# Patient Record
Sex: Male | Born: 2000 | Race: Black or African American | Hispanic: No | Marital: Single | Smoking: Never smoker
Health system: Southern US, Community
[De-identification: ages and names within clinical notes are randomized; demographics above are authoritative.]

## PROBLEM LIST (undated history)

## (undated) DIAGNOSIS — K219 Gastro-esophageal reflux disease without esophagitis: Secondary | ICD-10-CM

## (undated) DIAGNOSIS — F419 Anxiety disorder, unspecified: Secondary | ICD-10-CM

## (undated) HISTORY — DX: Gastro-esophageal reflux disease without esophagitis: K21.9

## (undated) HISTORY — DX: Anxiety disorder, unspecified: F41.9

## (undated) HISTORY — PX: OTHER SURGICAL HISTORY: SHX169

---
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
## 2020-12-20 ENCOUNTER — Ambulatory Visit: Payer: Self-pay | Attending: Internal Medicine

## 2020-12-20 DIAGNOSIS — Z23 Encounter for immunization: Secondary | ICD-10-CM

## 2020-12-20 NOTE — Progress Notes (Signed)
   Covid-19 Vaccination Clinic  Name:  Scott [name]    MRN: [id number] DOB: 08-Oct-2001  12/20/2020  [name] was observed post Covid-19 immunization for 15 minutes without incident. He was provided with Vaccine Information Sheet and instruction to access the V-Safe system.   [name] was instructed to call 911 with any severe reactions post vaccine: Marland Kitchen Difficulty breathing  . Swelling of face and throat  . A fast heartbeat  . A bad rash all over body  . Dizziness and weakness   Immunizations Administered    Name Date Dose VIS Date Route   Pfizer COVID-19 Vaccine 12/20/2020 12:07 PM 0.3 mL 10/01/2020 Intramuscular   Manufacturer: [employer], [employer]   Lot: [id number]   NDC: [address]-2

## 2021-01-09 ENCOUNTER — Ambulatory Visit: Payer: BC Managed Care – PPO | Admitting: Family Medicine

## 2021-01-09 ENCOUNTER — Encounter: Payer: Self-pay | Admitting: Family Medicine

## 2021-01-09 VITALS — BP 130/68 | HR 82 | Ht 67.0 in | Wt 161.8 lb

## 2021-01-09 DIAGNOSIS — K219 Gastro-esophageal reflux disease without esophagitis: Secondary | ICD-10-CM | POA: Diagnosis not present

## 2021-01-09 DIAGNOSIS — Z7689 Persons encountering health services in other specified circumstances: Secondary | ICD-10-CM

## 2021-01-09 NOTE — Patient Instructions (Signed)
Try to avoid eating foods that cause you heartburn and acid reflux.   Avoid overeating and try to eat small, frequent meals.   Avoid eating and laying down within 2-3 hours.   Continue taking omeprazole once daily as needed. You can also try Tums or Pepcid in the evening or in addition for breakthrough symptoms.   Let me know if your symptoms are worsening or you are having to take medication more often.   Try getting yourself on a sleep schedule.     Food Choices for Gastroesophageal Reflux Disease, Adult When you have gastroesophageal reflux disease (GERD), the foods you eat and your eating habits are very important. Choosing the right foods can help ease your discomfort. Think about working with a food expert (dietitian) to help you make good choices. What are tips for following this plan? Reading food labels  Look for foods that are low in saturated fat. Foods that may help with your symptoms include: ? Foods that have less than 5% of daily value (DV) of fat. ? Foods that have 0 grams of trans fat. Cooking  Do not fry your food.  Cook your food by baking, steaming, grilling, or broiling. These are all methods that do not need a lot of fat for cooking.  To add flavor, try to use herbs that are low in spice and acidity. Meal planning  Choose healthy foods that are low in fat, such as: ? Fruits and vegetables. ? Whole grains. ? Low-fat dairy products. ? Lean meats, fish, and poultry.  Eat small meals often instead of eating 3 large meals each day. Eat your meals slowly in a place where you are relaxed. Avoid bending over or lying down until 2-3 hours after eating.  Limit high-fat foods such as fatty meats or fried foods.  Limit your intake of fatty foods, such as oils, butter, and shortening.  Avoid the following as told by your doctor: ? Foods that cause symptoms. These may be different for different people. Keep a food diary to keep track of foods that cause  symptoms. ? Alcohol. ? Drinking a lot of liquid with meals. ? Eating meals during the 2-3 hours before bed.   Lifestyle  Stay at a healthy weight. Ask your doctor what weight is healthy for you. If you need to lose weight, work with your doctor to do so safely.  Exercise for at least 30 minutes on 5 or more days each week, or as told by your doctor.  Wear loose-fitting clothes.  Do not smoke or use any products that contain nicotine or tobacco. If you need help quitting, ask your doctor.  Sleep with the head of your bed higher than your feet. Use a wedge under the mattress or blocks under the bed frame to raise the head of the bed.  Chew sugar-free gum after meals. What foods should eat? Eat a healthy, well-balanced diet of fruits, vegetables, whole grains, low-fat dairy products, lean meats, fish, and poultry. Each person is different. Foods that may cause symptoms in one person may not cause any symptoms in another person. Work with your doctor to find foods that are safe for you. The items listed above may not be a complete list of what you can eat and drink. Contact a food expert for more options.   What foods should I avoid? Limiting some of these foods may help in managing the symptoms of GERD. Everyone is different. Talk with a food expert or your doctor to  help you find the exact foods to avoid, if any. Fruits Any fruits prepared with added fat. Any fruits that cause symptoms. For some people, this may include citrus fruits, such as oranges, grapefruit, pineapple, and lemons. Vegetables Deep-fried vegetables. [place] fries. Any vegetables prepared with added fat. Any vegetables that cause symptoms. For some people, this may include tomatoes and tomato products, chili peppers, onions and garlic, and horseradish. Grains Pastries or quick breads with added fat. Meats and other proteins High-fat meats, such as fatty beef or pork, hot dogs, ribs, ham, sausage, salami, and bacon. Fried  meat or protein, including fried fish and fried chicken. Nuts and nut butters, in large amounts. Dairy Whole milk and chocolate milk. Sour cream. Cream. Ice cream. Cream cheese. Milkshakes. Fats and oils Butter. Margarine. Shortening. Ghee. Beverages Coffee and tea, with or without caffeine. Carbonated beverages. Sodas. Energy drinks. Fruit juice made with acidic fruits, such as orange or grapefruit. Tomato juice. Alcoholic drinks. Sweets and desserts Chocolate and cocoa. Donuts. Seasonings and condiments Pepper. Peppermint and spearmint. Added salt. Any condiments, herbs, or seasonings that cause symptoms. For some people, this may include curry, hot sauce, or vinegar-based salad dressings. The items listed above may not be a complete list of what you should not eat and drink. Contact a food expert for more options. Questions to ask your doctor Diet and lifestyle changes are often the first steps that are taken to manage symptoms of GERD. If diet and lifestyle changes do not help, talk with your doctor about taking medicines. Where to find more information  International Foundation for Gastrointestinal Disorders: aboutgerd.org Summary  When you have GERD, food and lifestyle choices are very important in easing your symptoms.  Eat small meals often instead of 3 large meals a day. Eat your meals slowly and in a place where you are relaxed.  Avoid bending over or lying down until 2-3 hours after eating.  Limit high-fat foods such as fatty meats or fried foods. This information is not intended to replace advice given to you by your health care provider. Make sure you discuss any questions you have with your health care provider. Document Revised: 06/09/2020 Document Reviewed: 06/09/2020 Elsevier Patient Education  2021 [employer].

## 2021-01-09 NOTE — Progress Notes (Signed)
   Subjective:    Patient ID: Scott [name], male    DOB: 2001-10-04, 20 y.o.   MRN: [id number]  HPI Chief Complaint  Patient presents with  . new pt    New pt get established. Acid reflux- been going on a while, trouble sleeping   He is new to the practice and here today to establish care. Denies any regular previous medical care over the past 3 years.  Denies any significant past medical history.  No surgeries.  Concerns today include acid reflux and difficulty sleeping.  States he has a burning sensation in his chest after eating certain foods or drinking beverages such as orange juice.  States this has been going on for a year or longer.  States he takes omeprazole and occasionally a chewable antacid.  States this treatment usually improves his symptoms.   He also reports that he stays up all night and usually goes to bed right before daylight and then sleeps during the day.  States he is not currently in school or working so he is not on any particular schedule.  Denies having a TV in his room.  Denies fever, chills, fatigue, loss of appetite, unexplained weight loss, dizziness, chest pain, palpitations, shortness of breath, abdominal pain, N/V/D, or urinary symptoms.   Denies alcohol use, smoking or drug use.   Review of Systems Pertinent positives and negatives in the history of present illness.     Objective:   Physical Exam BP 130/68   Pulse 82   Ht 5\' 7"  (1.702 m)   Wt 161 lb 12.8 oz (73.4 kg)   BMI 25.34 kg/m   Alert and in no distress. Cardiac exam shows a regular sinus rhythm without murmurs or gallops. Lungs are clear to auscultation.  Abdomen is soft, nondistended, normal bowel sounds, nontender, no rebound or guarding.       Assessment & Plan:  Gastroesophageal reflux disease, unspecified whether esophagitis present  Sleep concern  Encounter to establish care  He is a 20 year old male who is new to the practice and here to establish care. Discussed  in depth how to prevent acid reflux and a handout was also provided.  Recommend he avoid foods and beverages that trigger his reflux symptoms.  Encouraged eating smaller more frequent meals and avoiding eating and laying down.  Offered referral to GI for further evaluation and he declines at this time. He will continue taking omeprazole over-the-counter as needed and may also try Pepcid or Tums.  Encouraged him to let me know if his symptoms are worsening or he is needing the medication more often. I recommend that he come up with a better sleep-wake schedule.  This is more difficult since he is not having to get up to go to work or class.  Encouraged him to try and go to bed to sleep around 11 PM or midnight and wake up at 7 AM or 8 AM He may follow-up for a CPE at his convenience

## 2021-01-13 ENCOUNTER — Ambulatory Visit: Payer: Self-pay

## 2021-03-04 NOTE — Patient Instructions (Addendum)
You were seen today for a wellness visit.  You may return at your convenience for immunizations including your Covid booster, Tdap (tetanus, diphtheria and pertussis) if you find that it has been more than 10 years since your last one, and the Gardasil vaccine other known as human papilloma virus.  This is a 3 shot series. You can schedule a nurse visit for any of these vaccines.  I recommend scheduling a dental exam. I also recommend an eye exam when you are due.  Make sure you are doing monthly self testicular exams as we discussed.  Continue eating a healthy well-balanced diet and try to get at least 150 minutes of physical activity each week.  We will be in touch with your lab results.   Preventive Care 60-78 Years Old, Male Preventive care refers to lifestyle choices and visits with your health care provider that can promote health and wellness. At this stage in your life, you may start seeing a primary care physician instead of a pediatrician. It is important to take responsibility for your health and well-being. Preventive care for young adults includes:  A yearly physical exam. This is also called an annual wellness visit.  Regular dental and eye exams.  Immunizations.  Screening for certain conditions.  Healthy lifestyle choices, such as: ? Eating a healthy diet. ? Getting regular exercise. ? Not using drugs or products that contain nicotine and tobacco. ? Limiting alcohol use. What can I expect for my preventive care visit? Physical exam Your health care provider may check your:  Height and weight. These may be used to calculate your BMI (body mass index). BMI is a measurement that tells if you are at a healthy weight.  Heart rate and blood pressure.  Body temperature.  Skin for abnormal spots. Counseling Your health care provider may ask you questions about your:  Past medical problems.  Family's medical history.  Alcohol, tobacco, and drug use.  Home  life and relationship well-being.  Access to firearms.  Emotional well-being.  Diet, exercise, and sleep habits.  Sexual activity and sexual health. What immunizations do I need? Vaccines are usually given at various ages, according to a schedule. Your health care provider will recommend vaccines for you based on your age, medical history, and lifestyle or other factors, such as travel or where you work.   What tests do I need? Blood tests  Lipid and cholesterol levels. These may be checked every 5 years starting at age 64.  Hepatitis C test.  Hepatitis B test. Screening  Genital exam to check for testicular cancer or hernias.  STD (sexually transmitted disease) testing, if you are at risk. Other tests  Tuberculosis skin test.  Vision and hearing tests.  Skin exam. Talk with your health care provider about your test results, treatment options, and if necessary, the need for more tests. Follow these instructions at home: Eating and drinking  Eat a healthy diet that includes fresh fruits and vegetables, whole grains, lean protein, and low-fat dairy products.  Drink enough fluid to keep your urine pale yellow.  Do not drink alcohol if: ? Your health care provider tells you not to drink. ? You are under the legal drinking age. In the [place] the legal drinking age is 23.  If you drink alcohol: ? Limit how much you use to 0-2 drinks a day. ? Be aware of how much alcohol is in your drink. In the [place] one drink equals one 12 oz bottle of beer (355  mL), one 5 oz glass of wine (148 mL), or one 1 oz glass of hard liquor (44 mL).   Lifestyle  Take daily care of your teeth and gums. Brush your teeth every morning and night with fluoride toothpaste. Floss one time each day.  Stay active. Exercise for at least 30 minutes 5 or more days of the week.  Do not use any products that contain nicotine or tobacco, such as cigarettes, e-cigarettes, and chewing tobacco. If you need help  quitting, ask your health care provider.  Do not use drugs.  If you are sexually active, practice safe sex. Use a condom or other form of protection to prevent STIs (sexually transmitted infections).  Find healthy ways to cope with stress, such as: ? Meditation, yoga, or listening to music. ? Journaling. ? Talking to a trusted person. ? Spending time with friends and family. Safety  Always wear your seat belt while driving or riding in a vehicle.  Do not drive: ? If you have been drinking alcohol. Do not ride with someone who has been drinking. ? When you are tired or distracted. ? While texting.  Wear a helmet and other protective equipment during sports activities.  If you have firearms in your house, make sure you follow all gun safety procedures.  Seek help if you have been bullied, physically abused, or sexually abused.  Use the Internet responsibly to avoid dangers, such as online bullying and online sex predators. What's next?  Go to your health care provider once a year for an annual wellness visit.  Ask your health care provider how often you should have your eyes and teeth checked.  Stay up to date on all vaccines. This information is not intended to replace advice given to you by your health care provider. Make sure you discuss any questions you have with your health care provider. Document Revised: 08/15/2019 Document Reviewed: 11/23/2018 Elsevier Patient Education  2021 [employer].

## 2021-03-04 NOTE — Progress Notes (Signed)
Subjective:    Patient ID: Scott [name], male    DOB: 2001-06-10, 20 y.o.   MRN: [id number]  HPI Chief Complaint  Patient presents with  . Annual Exam    CPE some issues with acid reflux sometimes daily and sometimes weekly   He is here for a complete physical exam.   Other providers: None   GERD-reports taking acid medication and this has improved.   Reports sleeping better.  No new concerns today.   Social history: Lives with mother and sister, is not currently working or in school. Denies smoking, drinking alcohol, drug use Diet: home cooked meals and frozen meals.  Exercise: nothing regular. Squats and stretching   Immunizations: He did receive 2 Covid vaccines.  Unclear as to when he had his last Tdap.  HPV never.  Health maintenance:  Colonoscopy: N/A Last PSA: N/A Last Dental Exam: over a year  Last Eye Exam: last year   Wears seatbelt always, smoke detectors in home and functioning, does not text while driving, feels safe in home environment.  Reviewed allergies, medications, past medical, surgical, family, and social history.   Review of Systems Review of Systems Constitutional: -fever, -chills, -sweats, -unexpected weight change,-fatigue ENT: -runny nose, -ear pain, -sore throat Cardiology:  -chest pain, -palpitations, -edema Respiratory: -cough, -shortness of breath, -wheezing Gastroenterology: -abdominal pain, -nausea, -vomiting, -diarrhea, -constipation  Hematology: -bleeding or bruising problems Musculoskeletal: -arthralgias, -myalgias, -joint swelling, -back pain Ophthalmology: -vision changes Urology: -dysuria, -difficulty urinating, -hematuria, -urinary frequency, -urgency Neurology: -headache, -weakness, -tingling, -numbness       Objective:   Physical Exam BP 120/78   Pulse 65   Temp 98.3 F (36.8 C)   Ht 5\' 7"  (1.702 m)   Wt 159 lb 6.4 oz (72.3 kg)   BMI 24.97 kg/m   General Appearance:    Alert, cooperative, no distress,  appears stated age  Head:    Normocephalic, without obvious abnormality, atraumatic  Eyes:    PERRL, conjunctiva/corneas clear, EOM's intact  Ears:    Normal TM's and external ear canals  Nose:   Mask on   Throat:   Mask on   Neck:   Supple, no lymphadenopathy;  thyroid:  no   enlargement/tenderness/nodules; no JVD  Back:    Spine nontender, no curvature, ROM normal, no CVA     tenderness  Lungs:     Clear to auscultation bilaterally without wheezes, rales or     ronchi; respirations unlabored  Chest Wall:    No tenderness or deformity   Heart:    Regular rate and rhythm, S1 and S2 normal, no murmur, rub   or gallop  Breast Exam:    No chest wall tenderness, masses or gynecomastia  Abdomen:     Soft, non-tender, nondistended, normoactive bowel sounds,    no masses, no hepatosplenomegaly  Genitalia:    Normal male external genitalia without lesions.  Testicles without masses.  No inguinal hernias. Chaperone present   Rectal:   Deferred due to age <40 and lack of symptoms  Extremities:   No clubbing, cyanosis or edema  Pulses:   2+ and symmetric all extremities  Skin:   Skin color, texture, turgor normal, no rashes or lesions  Lymph nodes:   Cervical, supraclavicular, and axillary nodes normal  Neurologic:   CNII-XII intact, normal strength, sensation and gait; reflexes 2+ and symmetric throughout          Psych:   Normal mood, affect, hygiene and grooming.  Assessment & Plan:  Routine general medical examination at a health care facility - Plan: CBC with Differential/Platelet, Comprehensive metabolic panel, TSH, T4, free, T3, Lipid panel  Need for hepatitis C screening test - Plan: Hepatitis C antibody  Screen for STD (sexually transmitted disease) - Plan: RPR, HIV Antibody (routine testing w rflx), GC/Chlamydia Probe Amp  Screening for lipid disorders - Plan: Lipid panel  Screening for thyroid disorder - Plan: TSH, T4, free, T3  He is here today for fasting CPE.  Preventive  health care reviewed.  Recommend regular dental and eye exams.  Recommend self testicular exams and advised of increased risk of testicular cancer for his age group.  Counseling on healthy diet and exercise. Screening done for STDs per patient request. Screening for hepatitis C per guidelines. He reports improvement in GERD and insomnia.  May continue on PPI for now Discussed safety and health promotion. Follow-up pending lab results.  Counseling done on immunizations for his age group and he may return for immunizations including Covid booster, Gardasil, Tdap.

## 2021-03-05 ENCOUNTER — Other Ambulatory Visit: Payer: Self-pay

## 2021-03-05 ENCOUNTER — Ambulatory Visit: Payer: BC Managed Care – PPO | Admitting: Family Medicine

## 2021-03-05 ENCOUNTER — Encounter: Payer: Self-pay | Admitting: Family Medicine

## 2021-03-05 VITALS — BP 120/78 | HR 65 | Temp 98.3°F | Ht 67.0 in | Wt 159.4 lb

## 2021-03-05 DIAGNOSIS — Z1329 Encounter for screening for other suspected endocrine disorder: Secondary | ICD-10-CM

## 2021-03-05 DIAGNOSIS — Z1159 Encounter for screening for other viral diseases: Secondary | ICD-10-CM

## 2021-03-05 DIAGNOSIS — Z13220 Encounter for screening for lipoid disorders: Secondary | ICD-10-CM

## 2021-03-05 DIAGNOSIS — Z0000 Encounter for general adult medical examination without abnormal findings: Secondary | ICD-10-CM

## 2021-03-05 DIAGNOSIS — Z113 Encounter for screening for infections with a predominantly sexual mode of transmission: Secondary | ICD-10-CM | POA: Diagnosis not present

## 2021-03-05 LAB — CBC WITH DIFFERENTIAL/PLATELET
Basophils Absolute: 0 10*3/uL (ref 0.0–0.2)
Basos: 1 %
Monocytes Absolute: 0.7 10*3/uL (ref 0.1–0.9)
Neutrophils Absolute: 4.2 10*3/uL (ref 1.4–7.0)

## 2021-03-05 LAB — COMPREHENSIVE METABOLIC PANEL

## 2021-03-05 LAB — TSH

## 2021-03-05 LAB — LIPID PANEL

## 2021-03-05 LAB — HIV ANTIBODY (ROUTINE TESTING W REFLEX)

## 2021-03-05 LAB — T3

## 2021-03-06 LAB — CBC WITH DIFFERENTIAL/PLATELET
EOS (ABSOLUTE): 0.3 10*3/uL (ref 0.0–0.4)
Eos: 4 %
Hematocrit: 44.7 % (ref 37.5–51.0)
Hemoglobin: 14.9 g/dL (ref 13.0–17.7)
Immature Grans (Abs): 0 10*3/uL (ref 0.0–0.1)
Immature Granulocytes: 0 %
Lymphocytes Absolute: 2.9 10*3/uL (ref 0.7–3.1)
Lymphs: 36 %
MCH: 28.5 pg (ref 26.6–33.0)
MCHC: 33.3 g/dL (ref 31.5–35.7)
MCV: 86 fL (ref 79–97)
Monocytes: 8 %
Neutrophils: 51 %
Platelets: 213 10*3/uL (ref 150–450)
RBC: 5.23 x10E6/uL (ref 4.14–5.80)
RDW: 13.3 % (ref 11.6–15.4)
WBC: 8.1 10*3/uL (ref 3.4–10.8)

## 2021-03-06 LAB — RPR: RPR Ser Ql: NONREACTIVE

## 2021-03-06 LAB — COMPREHENSIVE METABOLIC PANEL
ALT: 12 IU/L (ref 0–44)
AST: 20 IU/L (ref 0–40)
Albumin/Globulin Ratio: 1.3 (ref 1.2–2.2)
Alkaline Phosphatase: 85 IU/L (ref 44–121)
BUN/Creatinine Ratio: 20 (ref 9–20)
BUN: 19 mg/dL (ref 6–20)
Bilirubin Total: 0.4 mg/dL (ref 0.0–1.2)
CO2: 22 mmol/L (ref 20–29)
Creatinine, Ser: 0.95 mg/dL (ref 0.76–1.27)
Glucose: 92 mg/dL (ref 65–99)
Potassium: 4 mmol/L (ref 3.5–5.2)
Sodium: 142 mmol/L (ref 134–144)
Total Protein: 7.8 g/dL (ref 6.0–8.5)
eGFR: 117 mL/min/{1.73_m2} (ref >59)

## 2021-03-06 LAB — LIPID PANEL
Chol/HDL Ratio: 2.9 ratio (ref 0.0–5.0)
Cholesterol, Total: 139 mg/dL (ref 100–199)
HDL: 48 mg/dL (ref >39)
Triglycerides: 104 mg/dL (ref 0–149)

## 2021-03-06 LAB — GC/CHLAMYDIA PROBE AMP
Chlamydia trachomatis, NAA: NEGATIVE
Neisseria Gonorrhoeae by PCR: NEGATIVE

## 2021-03-06 LAB — T4, FREE: Free T4: 1.25 ng/dL (ref 0.82–1.77)

## 2021-03-06 LAB — HEPATITIS C ANTIBODY: Hep C Virus Ab: 0.1 s/co ratio (ref 0.0–0.9)

## 2021-03-06 NOTE — Progress Notes (Signed)
All of his labs are normal I am still waiting on the urine results, this usually takes a couple of days.

## 2021-03-08 NOTE — Progress Notes (Signed)
He is negative for gonorrhea and chlamydia which is good.

## 2021-05-07 ENCOUNTER — Encounter: Payer: Self-pay | Admitting: Family Medicine

## 2021-05-07 ENCOUNTER — Other Ambulatory Visit: Payer: Self-pay

## 2021-05-07 ENCOUNTER — Ambulatory Visit: Payer: BC Managed Care – PPO | Admitting: Family Medicine

## 2021-05-07 VITALS — BP 120/70 | HR 82 | Wt 155.0 lb

## 2021-05-07 DIAGNOSIS — K219 Gastro-esophageal reflux disease without esophagitis: Secondary | ICD-10-CM | POA: Diagnosis not present

## 2021-05-07 DIAGNOSIS — R1310 Dysphagia, unspecified: Secondary | ICD-10-CM | POA: Diagnosis not present

## 2021-05-07 DIAGNOSIS — L219 Seborrheic dermatitis, unspecified: Secondary | ICD-10-CM | POA: Diagnosis not present

## 2021-05-07 MED ORDER — KETOCONAZOLE 2 % EX SHAM: 1.0000 "application " | MEDICATED_SHAMPOO | CUTANEOUS | 0 refills | Status: DC

## 2021-05-07 NOTE — Patient Instructions (Signed)
Avoid foods that make your stomach pain worse or foods that cause you difficulty swallowing.  Continue on the omeprazole for now.  You will hear from [facility] gastroenterology to schedule a visit.  Use the shampoo I prescribed you for your neck. Continue using a good moisturizer and over the counter hydrocortisone as needed but never more than 1-2 weeks at a time. Let me know if your eczema is getting worse.

## 2021-05-07 NOTE — Progress Notes (Signed)
   Subjective:    Patient ID: Scott [name], male    DOB: 10/24/2001, 20 y.o.   MRN: [id number]  HPI Chief Complaint  Patient presents with  . Abdominal Pain    Stomach pain is not helping with what was recommended, more acid reflux   He is here with his mother complaining of persistent upper abdominal pain and burning in his chest.  He is taking omeprazole and it does not seem to be helping. States he also has noticed that food sometimes seems to get stuck and he points to his chest.  He would also like to discuss eczema and a rash on the back of his neck just below the hairline.  He has been using Eucerin and other over-the-counter topical medications.  Using hydrocortisone per mother.  Denies fever, chills, dizziness, chest pain, palpitations, shortness of breath, cough, nausea, vomiting or diarrhea.  No changes in bowel habits. He and his mother report that he has a good appetite and is still eating normally.   Review of Systems Pertinent positives and negatives in the history of present illness.     Objective:   Physical Exam Constitutional:      General: He is not in acute distress.    Appearance: He is well-developed. He is not ill-appearing.  HENT:     Head:      Comments: Dry patch with some scaling to his right posterior scalp and below hairline Cardiovascular:     Rate and Rhythm: Normal rate and regular rhythm.     Heart sounds: Normal heart sounds.  Pulmonary:     Effort: Pulmonary effort is normal.     Breath sounds: Normal breath sounds.  Abdominal:     General: Abdomen is flat. Bowel sounds are normal. There is no distension.     Palpations: Abdomen is soft. There is no hepatomegaly.     Tenderness: There is no abdominal tenderness. There is no guarding or rebound. Negative signs include Murphy's sign and McBurney's sign.  Skin:    General: Skin is warm and dry.     Capillary Refill: Capillary refill takes less than 2 seconds.  Neurological:     Mental  Status: He is alert.    BP 120/70   Pulse 82   Wt 155 lb (70.3 kg)   BMI 24.28 kg/m         Assessment & Plan:  Gastroesophageal reflux disease, unspecified whether esophagitis present - Plan: Ambulatory referral to Gastroenterology  Dysphagia, unspecified type - Plan: Ambulatory referral to Gastroenterology  Seborrheic eczema of scalp - Plan: ketoconazole (NIZORAL) 2 % shampoo  He has a documented 4 pound weight loss since his last visit in March. Referral to GI for further evaluation. Discussed avoiding foods that trigger symptoms.  Continue with good moisturizer and OTC steroid prn. I will treat with with ketoconazole for his neck and scalp and he will follow up if worsening or not improving. Consider referral to dermatologist if he gets much worse.

## 2021-05-25 ENCOUNTER — Encounter: Payer: Self-pay | Admitting: Nurse Practitioner

## 2021-06-12 HISTORY — PX: ESOPHAGEAL DILATION: SHX303

## 2021-06-12 HISTORY — PX: ESOPHAGOGASTRODUODENOSCOPY: SHX1529

## 2021-06-25 ENCOUNTER — Encounter: Payer: Self-pay | Admitting: Nurse Practitioner

## 2021-06-25 ENCOUNTER — Ambulatory Visit: Payer: BC Managed Care – PPO | Admitting: Nurse Practitioner

## 2021-06-25 VITALS — BP 120/60 | HR 68 | Ht 67.0 in | Wt 155.0 lb

## 2021-06-25 DIAGNOSIS — K219 Gastro-esophageal reflux disease without esophagitis: Secondary | ICD-10-CM | POA: Diagnosis not present

## 2021-06-25 DIAGNOSIS — R1310 Dysphagia, unspecified: Secondary | ICD-10-CM

## 2021-06-25 NOTE — Patient Instructions (Addendum)
Continue Omeprazole every AM   You have been scheduled for an endoscopy. Please follow written instructions given to you at your visit today. If you use inhalers (even only as needed), please bring them with you on the day of your procedure.   If you are age 22 or older, your body mass index should be between 23-30. Your Body mass index is 24.28 kg/m. If this is out of the aforementioned range listed, please consider follow up with your Primary Care Provider.  If you are age 25 or younger, your body mass index should be between 19-25. Your Body mass index is 24.28 kg/m. If this is out of the aformentioned range listed, please consider follow up with your Primary Care Provider.   __________________________________________________________  The [place] [facility] providers would like to encourage you to use [facility] to communicate with providers for non-urgent requests or questions.  Due to long hold times on the telephone, sending your provider a message by [facility] may be a faster and more efficient way to get a response.  Please allow 48 business hours for a response.  Please remember that this is for non-urgent requests.    Due to recent changes in healthcare laws, you may see the results of your imaging and laboratory studies on MyChart before your provider has had a chance to review them.  We understand that in some cases there may be results that are confusing or concerning to you. Not all laboratory results come back in the same time frame and the provider may be waiting for multiple results in order to interpret others.  Please give [place] 48 hours in order for your provider to thoroughly review all the results before contacting the office for clarification of your results.    I appreciate the  opportunity to care for you  Thank You   Midge Minium

## 2021-06-25 NOTE — Progress Notes (Signed)
     ASSESSMENT AND PLAN    # 20 year old male with 2-year history of GERD symptoms  Having frequent breakthrough heartburn and regurgitation despite daily PPI and antireflux measures. He also has been having intermittent solid food dysphagia over the last several months.   -- For further evaluation patient will be scheduled for an EGD with possible dilation. The risks and benefits of EGD with possible biopsies was discussed with the patient and he agrees to proceed.  --Continue daily Omeprazole. At some point will consider adding famotidine in the evening    HISTORY OF PRESENT ILLNESS     Chief Complaint : GERD  Scott [name] is a 20 y.o. healthy male , new to the practice and referred by PCP for evaluation of GERD.    Patient is here with his mother . He gives a 2-year history of heartburn and regurgitation.  He has tried Tums which helped to some degree.  He has tried multiple other antacids without significant benefit.   A few months ago he stopped eating spicy foods which helped. He does not consume caffeinated beverages.  He goes to bed on an empty stomach.  Despite taking OTC omeprazole daily for the last year he continues to get GERD symptoms about 4 times a week, mainly in the evening.  In addition to GERD symptoms patient has had occasional solid food dysphagia over the last several months.  He has unintentionally lost about 6 pounds in the last 2 months.  No other GI complaints.  Data Reviewed: 03/05/2021 CMP, CBC normal TSH and free T4 normal   PREVIOUS EVALUATIONS:   none  Past Medical History:  Diagnosis Date   Acid reflux     No past surgical history on file. No family history on file. Social History   Tobacco Use   Smoking status: Never   Smokeless tobacco: Never  Substance Use Topics   Alcohol use: No   Drug use: Never   Current Outpatient Medications  Medication Sig Dispense Refill   ketoconazole (NIZORAL) 2 % shampoo Apply 1 application topically 2  (two) times a week. 120 mL 0   OMEPRAZOLE PO Take by mouth.     No current facility-administered medications for this visit.   No Known Allergies   Review of Systems: Positive for sleeping problems, allergies, anxiety.  All other systems reviewed and negative except where noted in HPI.    PHYSICAL EXAM :    Wt Readings from Last 3 Encounters:  05/07/21 155 lb (70.3 kg)  03/05/21 159 lb 6.4 oz (72.3 kg)  01/09/21 161 lb 12.8 oz (73.4 kg)    BP 120/60   Pulse 68   Ht 5\' 7"  (1.702 m)   Wt 155 lb (70.3 kg)   BMI 24.28 kg/m  Constitutional:  Pleasant male in no acute distress. Psychiatric: Normal mood and affect. Behavior is normal. EENT: Pupils normal.  Conjunctivae are normal. No scleral icterus. Neck supple.  Cardiovascular: Normal rate, regular rhythm. No edema Pulmonary/chest: Effort normal and breath sounds normal. No wheezing, rales or rhonchi. Abdominal: Soft, nondistended, nontender. Bowel sounds active throughout. There are no masses palpable. No hepatomegaly. Neurological: Alert and oriented to person place and time. Skin: Skin is warm and dry. No rashes noted.  , NP  06/25/2021, 11:38 AM  Cc:  Referring Provider 06/27/2021, NP-C

## 2021-07-10 ENCOUNTER — Other Ambulatory Visit: Payer: Self-pay

## 2021-07-10 ENCOUNTER — Ambulatory Visit (AMBULATORY_SURGERY_CENTER): Payer: BC Managed Care – PPO | Admitting: Internal Medicine

## 2021-07-10 ENCOUNTER — Encounter: Payer: Self-pay | Admitting: Internal Medicine

## 2021-07-10 VITALS — BP 103/47 | HR 65 | Temp 99.5°F | Resp 13 | Ht 67.0 in | Wt 155.0 lb

## 2021-07-10 DIAGNOSIS — K219 Gastro-esophageal reflux disease without esophagitis: Secondary | ICD-10-CM

## 2021-07-10 DIAGNOSIS — R1319 Other dysphagia: Secondary | ICD-10-CM

## 2021-07-10 DIAGNOSIS — K209 Esophagitis, unspecified without bleeding: Secondary | ICD-10-CM

## 2021-07-10 MED ORDER — SODIUM CHLORIDE 0.9 % IV SOLN: 500.0000 mL | Freq: Once | INTRAVENOUS | Status: DC

## 2021-07-10 NOTE — Op Note (Signed)
[facility] Patient Name: Euriah Matlack Procedure Date: 07/10/2021 2:44 PM MRN: [id number] Endoscopist: Beverley Fiedler , MD Age: 20 Referring MD:  Date of Birth: [birth year] Gender: Male Account #: [id number] Procedure:                Upper GI endoscopy Indications:              Dysphagia, Suspected gastro-esophageal reflux                            disease (heartburn/regurgitation) Medicines:                Monitored Anesthesia Care Procedure:                Pre-Anesthesia Assessment:                           - Prior to the procedure, a History and Physical                            was performed, and patient medications and                            allergies were reviewed. The patient's tolerance of                            previous anesthesia was also reviewed. The risks                            and benefits of the procedure and the sedation                            options and risks were discussed with the patient.                            All questions were answered, and informed consent                            was obtained. Prior Anticoagulants: The patient has                            taken no previous anticoagulant or antiplatelet                            agents. ASA Grade Assessment: II - A patient with                            mild systemic disease. After reviewing the risks                            and benefits, the patient was deemed in                            satisfactory condition to undergo the procedure.  After obtaining informed consent, the endoscope was                            passed under direct vision. Throughout the                            procedure, the patient's blood pressure, pulse, and                            oxygen saturations were monitored continuously. The                            Endoscope was introduced through the mouth, and                            advanced to the second part  of duodenum. The upper                            GI endoscopy was accomplished without difficulty.                            The patient tolerated the procedure well. Scope In: Scope Out: Findings:                 The examined esophagus was normal. Biopsies were                            obtained from the proximal and distal esophagus                            with cold forceps for histology to exclude                            eosinophilic esophagitis.                           No endoscopic abnormality was evident in the                            esophagus to explain the patient's complaint of                            dysphagia. It was decided, however, to proceed with                            dilation of the entire esophagus. The scope was                            withdrawn. Dilation was performed with a Maloney                            dilator with mild resistance at 52 Fr. The dilation  site was examined following endoscope reinsertion                            and showed no change.                           The gastroesophageal flap valve was visualized                            endoscopically and classified as Hill Grade II                            (fold present, opens with respiration).                           The entire examined stomach was normal.                           The examined duodenum was normal. Complications:            No immediate complications. Estimated Blood Loss:     Estimated blood loss: none. Impression:               - Normal esophagus. Biopsied.                           - No endoscopic esophageal abnormality to explain                            patient's dysphagia. Esophagus dilated with 52 Fr                            Maloney.                           - Normal stomach.                           - Normal examined duodenum. Recommendation:           - Patient has a contact number available for                             emergencies. The signs and symptoms of potential                            delayed complications were discussed with the                            patient. Return to normal activities tomorrow.                            Written discharge instructions were provided to the                            patient.                           -  Post-dilation protocol and then advance diet as                            tolerated.                           - Continue present medications.                           - Await pathology results.                           - If heartburn symptoms persistent then would                            recommend change in PPI to pantoprazole 40 mg once                            daily. If dysphagia persists after dilation and                            biopsies negative for inflammation then esophageal                            manometry is recommended. Beverley Fiedler, MD 07/10/2021 3:11:46 PM This report has been signed electronically.

## 2021-07-10 NOTE — Progress Notes (Signed)
VS by CW. ?

## 2021-07-10 NOTE — Progress Notes (Signed)
No problems noted in the recovery room. maw 

## 2021-07-10 NOTE — Progress Notes (Signed)
Addendum: Reviewed and agree with assessment and management plan. Burney Calzadilla M, MD  

## 2021-07-10 NOTE — Patient Instructions (Addendum)
Handout was given to your care partner on the esophageal dilatation diet to follow the rest of today. You may resume your current medications today. Await biopsy results.  May take 1-3 weeks to receive pathology results. Please call if any questions or concerns.      YOU HAD AN ENDOSCOPIC PROCEDURE TODAY AT THE  ENDOSCOPY CENTER:   Refer to the procedure report that was given to you for any specific questions about what was found during the examination.  If the procedure report does not answer your questions, please call your gastroenterologist to clarify.  If you requested that your care partner not be given the details of your procedure findings, then the procedure report has been included in a sealed envelope for you to review at your convenience later.  YOU SHOULD EXPECT: Some feelings of bloating in the abdomen. Passage of more gas than usual.  Walking can help get rid of the air that was put into your GI tract during the procedure and reduce the bloating. If you had a lower endoscopy (such as a colonoscopy or flexible sigmoidoscopy) you may notice spotting of blood in your stool or on the toilet paper. If you underwent a bowel prep for your procedure, you may not have a normal bowel movement for a few days.  Please Note:  You might notice some irritation and congestion in your nose or some drainage.  This is from the oxygen used during your procedure.  There is no need for concern and it should clear up in a day or so.  SYMPTOMS TO REPORT IMMEDIATELY:   Following upper endoscopy (EGD)  Vomiting of blood or coffee ground material  New chest pain or pain under the shoulder blades  Painful or persistently difficult swallowing  New shortness of breath  Fever of 100F or higher  Black, tarry-looking stools  For urgent or emergent issues, a gastroenterologist can be reached at any hour by calling [phone]. Do not use MyChart messaging for urgent concerns.    DIET:   Please  follow the esophageal dilatation diet the rest of today.  Handout was provided.    Drink plenty of fluids but you should avoid alcoholic beverages for 24 hours.  ACTIVITY:  You should plan to take it easy for the rest of today and you should NOT DRIVE or use heavy machinery until tomorrow (because of the sedation medicines used during the test).    FOLLOW UP: Our staff will call the number listed on your records 48-72 hours following your procedure to check on you and address any questions or concerns that you may have regarding the information given to you following your procedure. If we do not reach you, we will leave a message.  We will attempt to reach you two times.  During this call, we will ask if you have developed any symptoms of COVID 19. If you develop any symptoms (ie: fever, flu-like symptoms, shortness of breath, cough etc.) before then, please call [phone].  If you test positive for Covid 19 in the 2 weeks post procedure, please call and report this information to [place].    If any biopsies were taken you will be contacted by phone or by letter within the next 1-3 weeks.  Please call us at [phone] if you have not heard about the biopsies in 3 weeks.    SIGNATURES/CONFIDENTIALITY: You and/or your care partner have signed paperwork which will be entered into your electronic medical record.  These signatures attest  to the fact that that the information above on your After Visit Summary has been reviewed and is understood.  Full responsibility of the confidentiality of this discharge information lies with you and/or your care-partner.

## 2021-07-10 NOTE — Progress Notes (Signed)
To PACU, VSS. Report to [email or web address] 

## 2021-07-14 ENCOUNTER — Telehealth: Payer: Self-pay

## 2021-07-14 NOTE — Telephone Encounter (Signed)
  Follow up Call-  Call back number 07/10/2021  Post procedure Call Back phone  # [phone]  Permission to leave phone message Yes  Some recent data might be hidden     Patient questions:  Do you have a fever, pain , or abdominal swelling? No. Pain Score  0 *  Have you tolerated food without any problems? Yes.    Have you been able to return to your normal activities? Yes.    Do you have any questions about your discharge instructions: Diet   No. Medications  No. Follow up visit  No.  Do you have questions or concerns about your Care? No.  Actions: * If pain score is 4 or above: No action needed, pain <4.  Have you developed a fever since your procedure? no  2.   Have you had an respiratory symptoms (SOB or cough) since your procedure? no  3.   Have you tested positive for COVID 19 since your procedure no  4.   Have you had any family members/close contacts diagnosed with the COVID 19 since your procedure?  no   If yes to any of these questions please route to Laverna Peace, RN and Karlton Lemon, RN

## 2021-07-19 ENCOUNTER — Encounter: Payer: Self-pay | Admitting: Internal Medicine

## 2021-07-23 ENCOUNTER — Telehealth: Payer: Self-pay | Admitting: Nurse Practitioner

## 2021-07-23 MED ORDER — PANTOPRAZOLE SODIUM 40 MG PO TBEC: 40.0000 mg | DELAYED_RELEASE_TABLET | Freq: Every day | ORAL | 2 refills | Status: DC

## 2021-07-23 NOTE — Telephone Encounter (Signed)
Patient indicates that he continues to have problems with heartburn. Per EGD report, patient may be prescribed pantoprazole 40 mg qd if these symptoms continue. I have sent rx to the pharmacy and advised patient. He is also asked to discontinue the current omeprazole that he is taking.

## 2021-08-26 ENCOUNTER — Telehealth: Payer: Self-pay | Admitting: Family Medicine

## 2021-08-26 ENCOUNTER — Other Ambulatory Visit: Payer: Self-pay

## 2021-08-26 DIAGNOSIS — L219 Seborrheic dermatitis, unspecified: Secondary | ICD-10-CM

## 2021-08-26 MED ORDER — KETOCONAZOLE 2 % EX SHAM: 1.0000 "application " | MEDICATED_SHAMPOO | CUTANEOUS | 0 refills | Status: DC

## 2021-08-26 NOTE — Telephone Encounter (Signed)
Fax refill request from [facility]  Ketoconazole 2% shampoo  120 ml  Last filled 05/07/21

## 2021-09-15 ENCOUNTER — Telehealth: Payer: Self-pay | Admitting: Family Medicine

## 2021-09-15 NOTE — Telephone Encounter (Signed)
Mother called and states that pt needs derm referral and dentist referral States that you had mentioned to them before that you could give them a referral

## 2021-09-16 NOTE — Telephone Encounter (Signed)
Left voicemail to call back and provide lists to schedule or can send via mychart message

## 2021-10-22 ENCOUNTER — Other Ambulatory Visit: Payer: Self-pay | Admitting: Internal Medicine

## 2021-11-16 ENCOUNTER — Other Ambulatory Visit: Payer: Self-pay

## 2021-11-16 DIAGNOSIS — L219 Seborrheic dermatitis, unspecified: Secondary | ICD-10-CM

## 2021-11-16 MED ORDER — KETOCONAZOLE 2 % EX SHAM: 1.0000 "application " | MEDICATED_SHAMPOO | CUTANEOUS | 0 refills | Status: DC

## 2021-12-27 ENCOUNTER — Other Ambulatory Visit: Payer: Self-pay | Admitting: Medical

## 2021-12-27 DIAGNOSIS — L219 Seborrheic dermatitis, unspecified: Secondary | ICD-10-CM

## 2022-01-28 ENCOUNTER — Other Ambulatory Visit: Payer: Self-pay | Admitting: Internal Medicine

## 2022-03-01 ENCOUNTER — Other Ambulatory Visit: Payer: Self-pay | Admitting: Medical

## 2022-03-01 DIAGNOSIS — L219 Seborrheic dermatitis, unspecified: Secondary | ICD-10-CM

## 2022-03-05 NOTE — Progress Notes (Signed)
? ?Complete physical exam ? ? ?Patient: Scott [name]   DOB: [birth year]   21 y.o. Male  MRN: [id number] ?Visit Date: 03/08/2022 ? ?Chief Complaint  ?Patient presents with  ? Annual Exam  ?  Fasting CPE- Ringing in right ear and still having trouble with acid reflux.  ? ?Subjective  ?  ?Scott [name] is a 21 y.o. male who presents today for a complete physical exam.  ? ?Reports male is generally fairly well; reports ringing in his right ear for about 2-3 weeks; denies swimming; suspects he may have injured his ear cleaning it with a q-tip; remembers having frequent ear infections as a child, but doesn't remember if he ever had "tubes" in his ears; denies any symptoms of allergic rhinitis; reports continued symptoms with acid reflux; is taking Protonix 40 mg daily; reports he is avoiding stomach irritant foods. ? ? ?HPI ?HPI   ? ? Annual Exam   ? Additional comments: Fasting CPE- Ringing in right ear and still having trouble with acid reflux. ? ?  ?  ?Last edited by Deforest Hoyles, [place] on 03/08/2022  8:18 AM.  ?  ?  ? ? ?Past Medical History:  ?Diagnosis Date  ? Acid reflux   ? Anxiety   ? ?Past Surgical History:  ?Procedure Laterality Date  ? none    ? ?Social History  ? ?Socioeconomic History  ? Marital status: Single  ?  Spouse name: Not on file  ? Number of children: Not on file  ? Years of education: Not on file  ? Highest education level: Not on file  ?Occupational History  ? Not on file  ?Tobacco Use  ? Smoking status: Never  ? Smokeless tobacco: Never  ?Vaping Use  ? Vaping Use: Never used  ?Substance and Sexual Activity  ? Alcohol use: No  ? Drug use: Never  ? Sexual activity: Not Currently  ?Other Topics Concern  ? Not on file  ?Social History Narrative  ? Not on file  ? ?Social Determinants of Health  ? ?Financial Resource Strain: Not on file  ?Food Insecurity: Not on file  ?Transportation Needs: Not on file  ?Physical Activity: Not on file  ?Stress: Not on file  ?Social Connections: Not on file   ?Intimate Partner Violence: Not on file  ? ?Family Status  ?Relation Name Status  ? Mother  Alive  ? Father  Alive  ? Sister  Alive  ? Brother  Alive  ? Brother  Alive  ? Brother  Alive  ? MGM  Alive  ? MGF  Alive  ? PGM  Alive  ? PGF  Alive  ? Neg Hx  (Not Specified)  ? ?Family History  ?Problem Relation Age of Onset  ? Hypertension Mother   ? Colon cancer Neg Hx   ? Esophageal cancer Neg Hx   ? Pancreatic cancer Neg Hx   ? Stomach cancer Neg Hx   ? Liver disease Neg Hx   ? ?No Known Allergies  ?Patient Care Team: ?Marcellina Millin as PCP - General (Physician Assistant) ?Pyrtle, Lajuan Lines, MD as Consulting Physician (Gastroenterology)  ? ?Medications: ?Outpatient Medications Prior to Visit  ?Medication Sig  ? ketoconazole (NIZORAL) 2 % shampoo APPLY TOPICALLY 2 TIMES A WEEK  ? pantoprazole (PROTONIX) 40 MG tablet TAKE 1 TABLET(40 MG) BY MOUTH DAILY  ? ?No facility-administered medications prior to visit.  ? ? ?Review of Systems  ?Constitutional:  Negative for activity change and fever.  ?HENT:  Negative  for congestion, ear discharge, ear pain, facial swelling, postnasal drip, rhinorrhea and voice change.   ?Eyes:  Negative for pain and redness.  ?Respiratory:  Negative for cough.   ?Cardiovascular:  Negative for chest pain.  ?Gastrointestinal:  Negative for constipation, diarrhea and nausea.  ?Endocrine: Negative for polyuria.  ?Genitourinary:  Negative for flank pain.  ?Musculoskeletal:  Negative for gait problem and neck stiffness.  ?Skin:  Negative for color change and rash.  ?Neurological:  Negative for dizziness.  ?Hematological:  Negative for adenopathy.  ?Psychiatric/Behavioral:  Negative for agitation, behavioral problems and confusion.   ? ?Last CBC ?Lab Results  ?Component Value Date  ? WBC 8.1 03/05/2021  ? HGB 14.9 03/05/2021  ? HCT 44.7 03/05/2021  ? MCV 86 03/05/2021  ? MCH 28.5 03/05/2021  ? RDW 13.3 03/05/2021  ? PLT 213 03/05/2021  ? ?Last metabolic panel ?Lab Results  ?Component Value Date  ?  GLUCOSE 92 03/05/2021  ? NA 142 03/05/2021  ? K 4.0 03/05/2021  ? CL 99 03/05/2021  ? CO2 22 03/05/2021  ? BUN 19 03/05/2021  ? CREATININE 0.95 03/05/2021  ? EGFR 117 03/05/2021  ? CALCIUM 9.9 03/05/2021  ? PROT 7.8 03/05/2021  ? ALBUMIN 4.4 03/05/2021  ? LABGLOB 3.4 03/05/2021  ? AGRATIO 1.3 03/05/2021  ? BILITOT 0.4 03/05/2021  ? ALKPHOS 85 03/05/2021  ? AST 20 03/05/2021  ? ALT 12 03/05/2021  ? ?Last lipids ?Lab Results  ?Component Value Date  ? CHOL 139 03/05/2021  ? HDL 48 03/05/2021  ? [place] 72 03/05/2021  ? TRIG 104 03/05/2021  ? CHOLHDL 2.9 03/05/2021  ? ?  ? ?The ASCVD Risk score (Arnett DK, et al., 2019) failed to calculate for the following reasons: ?  The 2019 ASCVD risk score is only valid for ages 47 to 48 ? ? Objective  ?  ?BP 120/70   Pulse 71   Ht '5\' 7"'  (1.702 m)   Wt 170 lb (77.1 kg) Comment: Current weight  SpO2 98%   BMI 26.63 kg/m?  ? ?  ? ? ?Physical Exam ?Vitals and nursing note reviewed.  ?Constitutional:   ?   General: He is not in acute distress. ?   Appearance: Normal appearance.  ?HENT:  ?   Head: Normocephalic and atraumatic.  ?   Right Ear: Tympanic membrane, ear canal and external ear normal.  ?   Left Ear: Tympanic membrane, ear canal and external ear normal.  ?   Nose: No congestion.  ?Eyes:  ?   Extraocular Movements: Extraocular movements intact.  ?   Conjunctiva/sclera: Conjunctivae normal.  ?   Pupils: Pupils are equal, round, and reactive to light.  ?Neck:  ?   Vascular: No carotid bruit.  ?Cardiovascular:  ?   Rate and Rhythm: Normal rate and regular rhythm.  ?   Pulses: Normal pulses.  ?   Heart sounds: Normal heart sounds.  ?Pulmonary:  ?   Effort: Pulmonary effort is normal.  ?   Breath sounds: Normal breath sounds. No wheezing.  ?Abdominal:  ?   General: Bowel sounds are normal.  ?   Palpations: Abdomen is soft.  ?Musculoskeletal:     ?   General: Normal range of motion.  ?   Cervical back: Normal range of motion and neck supple.  ?   Right lower leg: No edema.  ?    Left lower leg: No edema.  ?Skin: ?   General: Skin is warm and dry.  ?  Findings: No rash.  ?Neurological:  ?   Mental Status: He is alert and oriented to person, place, and time.  ?   Gait: Gait normal.  ?Psychiatric:     ?   Mood and Affect: Mood normal.     ?   Behavior: Behavior normal.  ?  ? ? ?Last depression screening scores ? ?  03/08/2022  ?  8:19 AM 05/07/2021  ? 10:47 AM 03/05/2021  ?  9:59 AM  ?PHQ 2/9 Scores  ?PHQ - 2 Score 0 0 0  ? ?Last fall risk screening ? ?  03/08/2022  ?  8:19 AM  ?Fall Risk   ?Falls in the past year? 0  ?Number falls in past yr: 0  ?Injury with Fall? 0  ?Risk for fall due to : No Fall Risks  ?Follow up Falls evaluation completed  ? ? ? ?No results found for any visits on 03/08/22. ? Assessment & Plan  ?  ?Routine Health Maintenance and Physical Exam ? ?Exercise Activities and Dietary recommendations ? Goals   ?None ?  ? ? ?Immunization History  ?Administered Date(s) Administered  ? DTaP 11/11/1999, 02/24/2000, 04/26/2000, 01/25/2001, 06/04/2004  ? Hepatitis A 08/09/2007, 08/13/2008  ? Hepatitis B 11/11/1999, 02/24/2000, 01/25/2001  ? HiB (PRP-OMP) 11/11/1999, 02/24/2000, 01/25/2001  ? Influenza-Unspecified 10/27/2005, 10/11/2009  ? MMR 11/23/2000, 06/04/2004  ? Meningococcal Conjugate 01/14/2011  ? PFIZER(Purple Top)SARS-COV-2 Vaccination 12/20/2020  ? Pneumococcal Conjugate-13 11/11/1999, 02/24/2000, 04/26/2000, 01/25/2001  ? Td 01/14/2011  ? Tdap 01/14/2011  ? Varicella 11/23/2000, 08/09/2007  ? ? ?Health Maintenance  ?Topic Date Due  ? HPV VACCINES (1 - Male 2-dose series) 03/09/2023 (Originally 09/20/2010)  ? TETANUS/TDAP  03/09/2023 (Originally 01/14/2021)  ? Hepatitis C Screening  Completed  ? HIV Screening  Completed  ? INFLUENZA VACCINE  Discontinued  ? COVID-19 Vaccine  Discontinued  ? ? ?Discussed health benefits of physical activity, and encouraged him to engage in regular exercise appropriate for his age and condition. ? ?Problem List Items Addressed This Visit   ? ?  ?  Digestive  ? GERD without esophagitis  ?  stable, continue Pantoprazole 40 mg qd, avoid stomach irritants (tomatoes, oranges, lemons, limes, spicy/greasy food); patient will follow up with GI MD ?  ?  ? Relevant [place]

## 2022-03-07 NOTE — Patient Instructions (Addendum)
Preventative Care for Adults, Male ?   ?   REGULAR HEALTH EXAMS: ?A routine yearly physical is a good way to check in with your primary care provider about your health and preventive screening. It is also an opportunity to share updates about your health and any concerns you have, and receive a thorough all-over exam.  ?Most health insurance companies pay for at least some preventative services.  Check with your health plan for specific coverages. ? ?WHAT PREVENTATIVE SERVICES DO MEN NEED? ?Adult men should have their weight and blood pressure checked regularly.  ?Men age 35 and older should have their cholesterol levels checked regularly. ?Beginning at age 45 and continuing to age 75, men should be screened for colorectal cancer.  Certain people should may need continued testing until age 85. ?Other cancer screening may include exams for testicular and prostate cancer. ?Updating vaccinations is part of preventative care.  Vaccinations help protect against diseases such as the flu. ?Lab tests are generally done as part of preventative care to screen for anemia and blood disorders, to screen for problems with the kidneys and liver, to screen for bladder problems, to check blood sugar, and to check your cholesterol level. ?Preventative services generally include counseling about diet, exercise, avoiding tobacco, drugs, excessive alcohol consumption, and sexually transmitted infections.   ? ?GENERAL RECOMMENDATIONS FOR GOOD HEALTH: ? ?Healthy diet: ?Eat a variety of foods, including fruit, vegetables, animal or vegetable protein, such as meat, fish, chicken, and eggs, or beans, lentils, tofu, and grains, such as rice. ?Drink plenty of water daily (60 - 80 ounces or 8 - 10 glasses of water a day) ?Decrease saturated fat in the diet, avoid lots of red meat, processed foods, sweets, fast foods, and fried foods. For high cholesterol - Increase fiber intake (Benefiber or Metamucil, Cherrios,  oatmeal, beans, nuts, fruits  and vegetables), limit saturated fats (in fried foods, red meat), can add OTC fish oil supplement, eat fish with Omega-3 fatty acids like salmon and tuna, exercise for 30 minutes 3 - 5 times a week, drink 8 - 10 glasses of water a day. ? ?Exercise: ?Aerobic exercise helps maintain good heart health. At least 30-40 minutes of moderate-intensity exercise is recommended. For example, a brisk walk that increases your heart rate and breathing. This should be done on most days of the week.  ?Find a type of exercise or a variety of exercises that you enjoy so that it becomes a part of your daily life.  Examples are running, walking, swimming, water aerobics, and biking.  For motivation and support, explore group exercise such as aerobic class, spin class, Zumba, Yoga,or  martial arts, etc.   ?Set exercise goals for yourself, such as a certain weight goal, walk or run in a race such as a 5k walk/run.  Speak to your primary care provider about exercise goals. ? ?Disease prevention: ?If you smoke or chew tobacco, find out from your caregiver how to quit. It can literally save your life, no matter how long you have been a tobacco user. If you do not use tobacco, never begin.  ?Maintain a healthy diet and normal weight. Increased weight leads to problems with blood pressure and diabetes.  ?The Body Mass Index or BMI is a way of measuring how much of your body is fat. Having a BMI above 27 increases the risk of heart disease, diabetes, hypertension, stroke and other problems related to obesity. Your caregiver can help determine your BMI and based on it develop   an exercise and dietary program to help you achieve or maintain this important measurement at a healthful level. ?High blood pressure causes heart and blood vessel problems.  Persistent high blood pressure should be treated with medicine if weight loss and exercise do not work.  ?Fat and cholesterol leaves deposits in your arteries that can block them. This causes heart  disease and vessel disease elsewhere in your body.  If your cholesterol is found to be high, or if you have heart disease or certain other medical conditions, then you may need to have your cholesterol monitored frequently and be treated with medication.  ?Ask if you should have a stress test if your history suggests this. A stress test is a test done on a treadmill that looks for heart disease. This test can find disease prior to there being a problem. ?Avoid drinking alcohol in excess (more than two drinks per day).  Avoid use of street drugs. Do not share needles with anyone. Ask for professional help if you need assistance or instructions on stopping the use of alcohol, cigarettes, and/or drugs. ?Brush your teeth twice a day with fluoride toothpaste, and floss once a day. Good oral hygiene prevents tooth decay and gum disease. The problems can be painful, unattractive, and can cause other health problems. Visit your dentist for a routine oral and dental check up and preventive care every 6-12 months.  ?Look at your skin regularly.  Use a mirror to look at your back. Notify your caregivers of changes in moles, especially if there are changes in shapes, colors, a size larger than a pencil eraser, an irregular border, or development of new moles. ? ?Safety: ?Use seatbelts 100% of the time, whether driving or as a passenger.  Use safety devices such as hearing protection if you work in environments with loud noise or significant background noise.  Use safety glasses when doing any work that could send debris in to the eyes.  Use a helmet if you ride a bike or motorcycle.  Use appropriate safety gear for contact sports.  Talk to your caregiver about gun safety. ?Use sunscreen with a SPF (or skin protection factor) of 15 or greater.  Lighter skinned people are at a greater risk of skin cancer. Don?t forget to also wear sunglasses in order to protect your eyes from too much damaging sunlight. Damaging sunlight can  accelerate cataract formation.  ?Practice safe sex. Use condoms. Condoms are used for birth control and to help reduce the spread of sexually transmitted infections (or STIs).  Some of the STIs are gonorrhea (the clap), chlamydia, syphilis, trichomonas, herpes, HPV (human papilloma virus) and HIV (human immunodeficiency virus) which causes AIDS. The herpes, HIV and HPV are viral illnesses that have no cure. These can result in disability, cancer and death.  ?Keep carbon monoxide and smoke detectors in your home functioning at all times. Change the batteries every 6 months or use a model that plugs into the wall.  ? ?Vaccinations: ?Stay up to date with your tetanus shots and other required immunizations. You should have a booster for tetanus every 10 years. Be sure to get your flu shot every year, since 5%-20% of the [place] population comes down with the flu. The flu vaccine changes each year, so being vaccinated once is not enough. Get your shot in the fall, before the flu season peaks.   ?Other vaccines to consider: ?Pneumococcal vaccine to protect against certain types of pneumonia.  This is normally recommended for adults age   65 or older.  However, adults younger than 23 years old with certain underlying conditions such as diabetes, heart or lung disease should also receive the vaccine. ?Shingles vaccine to protect against Varicella Zoster if you are older than age 60, or younger than 23 years old with certain underlying illness. ?Hepatitis A vaccine to protect against a form of infection of the liver by a virus acquired from food. ?Hepatitis B vaccine to protect against a form of infection of the liver by a virus acquired from blood or body fluids, particularly if you work in health care. ?If you plan to travel internationally, check with your local health department for specific vaccination recommendations. ? ?Cancer Screening: ?Most routine colon cancer screening begins at the age of 50. On a yearly basis,  doctors may provide special easy to use take-home tests to check for hidden blood in the stool. Sigmoidoscopy or colonoscopy can detect the earliest forms of colon cancer and is life saving. These tests use a smal

## 2022-03-08 ENCOUNTER — Encounter: Payer: Self-pay | Admitting: Physician Assistant

## 2022-03-08 ENCOUNTER — Ambulatory Visit: Payer: BC Managed Care – PPO | Admitting: Physician Assistant

## 2022-03-08 ENCOUNTER — Other Ambulatory Visit: Payer: Self-pay

## 2022-03-08 VITALS — BP 120/70 | HR 71 | Ht 67.0 in | Wt 170.0 lb

## 2022-03-08 DIAGNOSIS — K219 Gastro-esophageal reflux disease without esophagitis: Secondary | ICD-10-CM | POA: Insufficient documentation

## 2022-03-08 DIAGNOSIS — H9311 Tinnitus, right ear: Secondary | ICD-10-CM

## 2022-03-08 DIAGNOSIS — Z0000 Encounter for general adult medical examination without abnormal findings: Secondary | ICD-10-CM | POA: Diagnosis not present

## 2022-03-08 DIAGNOSIS — L219 Seborrheic dermatitis, unspecified: Secondary | ICD-10-CM | POA: Insufficient documentation

## 2022-03-08 NOTE — Assessment & Plan Note (Signed)
Will refer to ENT for hearing test and evaluation ?

## 2022-03-08 NOTE — Assessment & Plan Note (Addendum)
stable, continue Pantoprazole 40 mg qd, avoid stomach irritants (tomatoes, oranges, lemons, limes, spicy/greasy food); patient will follow up with GI MD ?

## 2022-03-09 ENCOUNTER — Encounter: Payer: Self-pay | Admitting: Physician Assistant

## 2022-03-09 DIAGNOSIS — R748 Abnormal levels of other serum enzymes: Secondary | ICD-10-CM | POA: Insufficient documentation

## 2022-03-09 LAB — CBC WITH DIFFERENTIAL/PLATELET
Basophils Absolute: 0 10*3/uL (ref 0.0–0.2)
Basos: 1 %
EOS (ABSOLUTE): 0.3 10*3/uL (ref 0.0–0.4)
Eos: 4 %
Hematocrit: 46.1 % (ref 37.5–51.0)
Hemoglobin: 15.6 g/dL (ref 13.0–17.7)
Immature Grans (Abs): 0 10*3/uL (ref 0.0–0.1)
Immature Granulocytes: 0 %
Lymphocytes Absolute: 2.5 10*3/uL (ref 0.7–3.1)
Lymphs: 41 %
MCH: 29.1 pg (ref 26.6–33.0)
MCHC: 33.8 g/dL (ref 31.5–35.7)
MCV: 86 fL (ref 79–97)
Monocytes Absolute: 0.6 10*3/uL (ref 0.1–0.9)
Monocytes: 10 %
Neutrophils Absolute: 2.8 10*3/uL (ref 1.4–7.0)
Neutrophils: 44 %
Platelets: 215 10*3/uL (ref 150–450)
RBC: 5.36 x10E6/uL (ref 4.14–5.80)
RDW: 12.6 % (ref 11.6–15.4)
WBC: 6.2 10*3/uL (ref 3.4–10.8)

## 2022-03-09 LAB — COMPREHENSIVE METABOLIC PANEL
ALT: 75 IU/L — ABNORMAL HIGH (ref 0–44)
AST: 52 IU/L — ABNORMAL HIGH (ref 0–40)
Albumin/Globulin Ratio: 1.7 (ref 1.2–2.2)
Albumin: 4.7 g/dL (ref 4.1–5.2)
Alkaline Phosphatase: 72 IU/L (ref 44–121)
BUN/Creatinine Ratio: 14 (ref 9–20)
BUN: 12 mg/dL (ref 6–20)
Bilirubin Total: 0.4 mg/dL (ref 0.0–1.2)
CO2: 27 mmol/L (ref 20–29)
Calcium: 9.9 mg/dL (ref 8.7–10.2)
Chloride: 101 mmol/L (ref 96–106)
Creatinine, Ser: 0.88 mg/dL (ref 0.76–1.27)
Globulin, Total: 2.7 g/dL (ref 1.5–4.5)
Glucose: 87 mg/dL (ref 70–99)
Potassium: 4.2 mmol/L (ref 3.5–5.2)
Sodium: 142 mmol/L (ref 134–144)
Total Protein: 7.4 g/dL (ref 6.0–8.5)
eGFR: 125 mL/min/{1.73_m2} (ref >59)

## 2022-03-09 LAB — LIPID PANEL
Chol/HDL Ratio: 2.6 ratio (ref 0.0–5.0)
Cholesterol, Total: 142 mg/dL (ref 100–199)
HDL: 55 mg/dL (ref >39)
LDL Chol Calc (NIH): 71 mg/dL (ref 0–99)
Triglycerides: 84 mg/dL (ref 0–149)
VLDL Cholesterol Cal: 16 mg/dL (ref 5–40)

## 2022-04-16 NOTE — Addendum Note (Signed)
Addended by: Burnard Hawthorne on: 04/16/2022 02:47 PM ? ? Modules accepted: Level of Service ? ?

## 2022-04-28 ENCOUNTER — Other Ambulatory Visit: Payer: Self-pay | Admitting: Internal Medicine

## 2022-08-11 ENCOUNTER — Other Ambulatory Visit: Payer: Self-pay | Admitting: Internal Medicine

## 2022-08-11 NOTE — Telephone Encounter (Signed)
Patient needs a routine yearly follow up for further refills.

## 2022-09-10 ENCOUNTER — Other Ambulatory Visit: Payer: Self-pay | Admitting: Internal Medicine

## 2022-10-06 ENCOUNTER — Ambulatory Visit (INDEPENDENT_AMBULATORY_CARE_PROVIDER_SITE_OTHER): Payer: BC Managed Care – PPO | Admitting: Medical

## 2022-10-06 ENCOUNTER — Encounter: Payer: Self-pay | Admitting: Medical

## 2022-10-06 VITALS — BP 120/70 | HR 76 | Wt 169.6 lb

## 2022-10-06 DIAGNOSIS — R7989 Other specified abnormal findings of blood chemistry: Secondary | ICD-10-CM | POA: Diagnosis not present

## 2022-10-06 DIAGNOSIS — L219 Seborrheic dermatitis, unspecified: Secondary | ICD-10-CM

## 2022-10-06 DIAGNOSIS — K219 Gastro-esophageal reflux disease without esophagitis: Secondary | ICD-10-CM

## 2022-10-06 DIAGNOSIS — R748 Abnormal levels of other serum enzymes: Secondary | ICD-10-CM | POA: Diagnosis not present

## 2022-10-06 MED ORDER — KETOCONAZOLE 2 % EX SHAM: MEDICATED_SHAMPOO | CUTANEOUS | 6 refills | Status: DC

## 2022-10-06 MED ORDER — PANTOPRAZOLE SODIUM 40 MG PO TBEC: 40.0000 mg | DELAYED_RELEASE_TABLET | Freq: Every day | ORAL | 1 refills | Status: DC

## 2022-10-06 MED ORDER — FAMOTIDINE 40 MG PO TABS: 40.0000 mg | ORAL_TABLET | Freq: Every day | ORAL | 1 refills | Status: DC

## 2022-10-06 NOTE — Progress Notes (Signed)
Subjective:  Scott [name] is a 21 y.o. male who presents for Chief Complaint  Patient presents with   discuss med    Discuss meds- needs refills on meds. Been out for 2 weeks     Here for medication refills.  History of acid reflux.  Needs refill of Protonix.  No major issues lately.  He tries to avoid acidic foods  History of scalp skin issues, uses Nizoral periodically.  Needs refill of this  Otherwise doing fine  History of elevated liver test-no recent alcohol use, no heavy NSAID or analgesic use over-the-counter.  No history of IV drug use.  No history of hepatitis exposure  No other aggravating or relieving factors.    No other c/o.  Past Medical History:  Diagnosis Date   Acid reflux    Anxiety    Past Surgical History:  Procedure Laterality Date   ESOPHAGEAL DILATION  06/2021   ESOPHAGOGASTRODUODENOSCOPY  06/2021     The following portions of the patient's history were reviewed and updated as appropriate: allergies, current medications, past family history, past medical history, past social history, past surgical history and problem list.  ROS Otherwise as in subjective above  Objective: BP 120/70   Pulse 76   Wt 169 lb 9.6 oz (76.9 kg)   BMI 26.56 kg/m   General appearance: alert, no distress, well developed, well nourished Abdomen: +bs, soft, non tender, non distended, no masses, no hepatomegaly, no splenomegaly Skin without obvious deformity today    Assessment: Encounter Diagnoses  Name Primary?   Elevated LFTs Yes   Seborrheic eczema of scalp    GERD without esophagitis    Elevated liver enzymes      Plan: Elevated LFTs-we discussed possible causes.  Updated labs today.  If labs still elevated we will pursue iron test, repeat hepatitis screening and ultrasound of liver.  We discussed avoiding fatty diet and avoiding alcohol  Seborrheic eczema of scalp-continue Nizoral shampoo as he has been doing  GERD-I reviewed his 2022 EGD and  dilatation.  We discussed avoidance of GERD triggers.  Avoid eating close to bedtime, avoid rushing through meals.  We discussed long-term risk of medications.  We will try famotidine and see if this works just as well which would be safer than long-term PPI use.  [name] was seen today for discuss med.  Diagnoses and all orders for this visit:  Elevated LFTs -     Hepatic function panel  Seborrheic eczema of scalp -     ketoconazole (NIZORAL) 2 % shampoo; APPLY TOPICALLY 2 TIMES A WEEK  GERD without esophagitis  Elevated liver enzymes  Other orders -     pantoprazole (PROTONIX) 40 MG tablet; Take 1 tablet (40 mg total) by mouth daily. -     famotidine (PEPCID) 40 MG tablet; Take 1 tablet (40 mg total) by mouth daily.    Follow up: pending labs

## 2022-10-07 LAB — HEPATIC FUNCTION PANEL
ALT: 34 IU/L (ref 0–44)
AST: 28 IU/L (ref 0–40)
Albumin: 4.8 g/dL (ref 4.3–5.2)
Alkaline Phosphatase: 73 IU/L (ref 44–121)
Bilirubin Total: 0.6 mg/dL (ref 0.0–1.2)
Bilirubin, Direct: 0.17 mg/dL (ref 0.00–0.40)
Total Protein: 7.9 g/dL (ref 6.0–8.5)

## 2022-10-26 ENCOUNTER — Encounter: Payer: Self-pay | Admitting: Internal Medicine

## 2022-10-29 ENCOUNTER — Other Ambulatory Visit: Payer: Self-pay | Admitting: Medical

## 2022-10-29 MED ORDER — SUCRALFATE 1 G PO TABS: 1.0000 g | ORAL_TABLET | Freq: Three times a day (TID) | ORAL | 0 refills | Status: DC

## 2022-11-10 ENCOUNTER — Other Ambulatory Visit: Payer: Self-pay | Admitting: Medical

## 2023-03-11 ENCOUNTER — Encounter: Payer: BC Managed Care – PPO | Admitting: Physician Assistant

## 2023-03-18 ENCOUNTER — Emergency Department (HOSPITAL_BASED_OUTPATIENT_CLINIC_OR_DEPARTMENT_OTHER)
Admission: EM | Admit: 2023-03-18 | Discharge: 2023-03-18 | Disposition: A | Discharge status: Home / Self Care | Payer: BC Managed Care – PPO | Attending: Emergency Medicine | Admitting: Emergency Medicine

## 2023-03-18 ENCOUNTER — Encounter: Payer: Self-pay | Admitting: Medical

## 2023-03-18 ENCOUNTER — Emergency Department (HOSPITAL_BASED_OUTPATIENT_CLINIC_OR_DEPARTMENT_OTHER): Payer: BC Managed Care – PPO

## 2023-03-18 ENCOUNTER — Other Ambulatory Visit: Payer: Self-pay

## 2023-03-18 ENCOUNTER — Encounter (HOSPITAL_BASED_OUTPATIENT_CLINIC_OR_DEPARTMENT_OTHER): Payer: Self-pay | Admitting: Emergency Medicine

## 2023-03-18 ENCOUNTER — Emergency Department (HOSPITAL_BASED_OUTPATIENT_CLINIC_OR_DEPARTMENT_OTHER): Payer: BC Managed Care – PPO | Admitting: Radiology

## 2023-03-18 ENCOUNTER — Ambulatory Visit (INDEPENDENT_AMBULATORY_CARE_PROVIDER_SITE_OTHER): Payer: BC Managed Care – PPO | Admitting: Medical

## 2023-03-18 VITALS — BP 118/74 | HR 71 | Ht 66.0 in | Wt 161.2 lb

## 2023-03-18 DIAGNOSIS — Z0000 Encounter for general adult medical examination without abnormal findings: Secondary | ICD-10-CM | POA: Diagnosis not present

## 2023-03-18 DIAGNOSIS — Z113 Encounter for screening for infections with a predominantly sexual mode of transmission: Secondary | ICD-10-CM

## 2023-03-18 DIAGNOSIS — S0181XA Laceration without foreign body of other part of head, initial encounter: Secondary | ICD-10-CM | POA: Insufficient documentation

## 2023-03-18 DIAGNOSIS — W01198A Fall on same level from slipping, tripping and stumbling with subsequent striking against other object, initial encounter: Secondary | ICD-10-CM | POA: Diagnosis not present

## 2023-03-18 DIAGNOSIS — S0191XA Laceration without foreign body of unspecified part of head, initial encounter: Secondary | ICD-10-CM | POA: Diagnosis present

## 2023-03-18 DIAGNOSIS — Z23 Encounter for immunization: Secondary | ICD-10-CM | POA: Diagnosis not present

## 2023-03-18 DIAGNOSIS — R55 Syncope and collapse: Secondary | ICD-10-CM | POA: Diagnosis not present

## 2023-03-18 DIAGNOSIS — L989 Disorder of the skin and subcutaneous tissue, unspecified: Secondary | ICD-10-CM | POA: Diagnosis not present

## 2023-03-18 DIAGNOSIS — Z13220 Encounter for screening for lipoid disorders: Secondary | ICD-10-CM | POA: Diagnosis not present

## 2023-03-18 DIAGNOSIS — S0990XA Unspecified injury of head, initial encounter: Secondary | ICD-10-CM

## 2023-03-18 DIAGNOSIS — Z7185 Encounter for immunization safety counseling: Secondary | ICD-10-CM

## 2023-03-18 DIAGNOSIS — M542 Cervicalgia: Secondary | ICD-10-CM | POA: Diagnosis not present

## 2023-03-18 DIAGNOSIS — Z1321 Encounter for screening for nutritional disorder: Secondary | ICD-10-CM

## 2023-03-18 DIAGNOSIS — F419 Anxiety disorder, unspecified: Secondary | ICD-10-CM

## 2023-03-18 LAB — POCT URINALYSIS DIP (PROADVANTAGE DEVICE)
Bilirubin, UA: NEGATIVE
Blood, UA: NEGATIVE
Glucose, UA: NEGATIVE mg/dL
Ketones, POC UA: NEGATIVE mg/dL
Leukocytes, UA: NEGATIVE
Nitrite, UA: NEGATIVE
Protein Ur, POC: NEGATIVE mg/dL
Specific Gravity, Urine: 1.03
Urobilinogen, Ur: 0.2
pH, UA: 6 (ref 5.0–8.0)

## 2023-03-18 MED ORDER — TETANUS-DIPHTH-ACELL PERTUSSIS 5-2.5-18.5 LF-MCG/0.5 IM SUSY
0.5000 mL | PREFILLED_SYRINGE | Freq: Once | INTRAMUSCULAR | Status: AC
  Administered 2023-03-18: 0.5 mL via INTRAMUSCULAR
  Filled 2023-03-18: qty 0.5

## 2023-03-18 MED ORDER — NAPROXEN 250 MG PO TABS
500.0000 mg | ORAL_TABLET | Freq: Once | ORAL | Status: AC
  Administered 2023-03-18: 500 mg via ORAL
  Filled 2023-03-18: qty 2

## 2023-03-18 NOTE — ED Notes (Signed)
Discharge instructions and follow up care reviewed and explained, pt verbalized understanding and had no further questions on d/c. Pt caox4, ambulatory, NAD on d/c. 

## 2023-03-18 NOTE — ED Triage Notes (Signed)
Pt arrives to ED with c/o LOC from PCP office. Pt notes that he passed out at his PCP office when giving blood. Pt family notes pt did hit head on the floor. Family notes LOC lasted less than 10 seconds. Pt has laceration to right forehead. He notes pain to his neck and head.

## 2023-03-18 NOTE — Progress Notes (Signed)
Subjective:   HPI  Scott [name] is a 22 y.o. male who presents for Chief Complaint  Patient presents with   Annual Exam    Fasting (ate banana and had ginger ale)annual exam, sees eye doctor and will schedule for this year. Still having reflux, ses Dr. Rhea Belton and will try to schedule with him. Needs referral to derm for some skin issues. Mom thinks he has some anxiety and would like referral to counselor.     Patient Care Team: Sunaina Ferrando, Cleda Mccreedy as PCP - General (Family Medicine) Pyrtle, Carie Caddy, MD as Consulting Physician (Gastroenterology)   Concerns: Of note, mom answers most questions today.  He did talk some but let mom address most questions.  Has skin concerns including acne, icthing skin that breaks out around lips, some on arms, dry skin.  Has been losing some hair central scalp and frontal.  No other hair loss.     Mom thinks he has some issues with anxiety, social anxiety.   Doesn't like to be around a lot of people.   Had a job briefly a few years ago, but had mental breakdown coming home.  Finished high school. Doesn't recall issues in high school, so these concerns happened as an adult about the time covid pandemic started.   Graduated high school 2019.   Mom notes hx/o anxiety and depression in herself.   Reviewed their medical, surgical, family, social, medication, and allergy history and updated chart as appropriate.  No Known Allergies  Past Medical History:  Diagnosis Date   Acid reflux    Anxiety     Current Outpatient Medications on File Prior to Visit  Medication Sig Dispense Refill   famotidine (PEPCID) 40 MG tablet Take 1 tablet (40 mg total) by mouth daily. 90 tablet 1   ketoconazole (NIZORAL) 2 % shampoo APPLY TOPICALLY 2 TIMES A WEEK 120 mL 6   pantoprazole (PROTONIX) 40 MG tablet Take 1 tablet (40 mg total) by mouth daily. 90 tablet 1   No current facility-administered medications on file prior to visit.      Current Outpatient  Medications:    famotidine (PEPCID) 40 MG tablet, Take 1 tablet (40 mg total) by mouth daily., Disp: 90 tablet, Rfl: 1   ketoconazole (NIZORAL) 2 % shampoo, APPLY TOPICALLY 2 TIMES A WEEK, Disp: 120 mL, Rfl: 6   pantoprazole (PROTONIX) 40 MG tablet, Take 1 tablet (40 mg total) by mouth daily., Disp: 90 tablet, Rfl: 1  Family History  Problem Relation Age of Onset   Anxiety disorder Mother    Hypertension Mother    Arrhythmia Mother    Colon cancer Neg Hx    Esophageal cancer Neg Hx    Pancreatic cancer Neg Hx    Stomach cancer Neg Hx    Liver disease Neg Hx     Past Surgical History:  Procedure Laterality Date   ESOPHAGEAL DILATION  06/2021   ESOPHAGOGASTRODUODENOSCOPY  06/2021   Review of Systems  Constitutional:  Negative for chills, fever, malaise/fatigue and weight loss.  HENT:  Negative for congestion, ear pain, hearing loss, sore throat and tinnitus.   Eyes:  Negative for blurred vision, pain and redness.  Respiratory:  Negative for cough, hemoptysis and shortness of breath.   Cardiovascular:  Negative for chest pain, palpitations, orthopnea, claudication and leg swelling.  Gastrointestinal:  Negative for abdominal pain, blood in stool, constipation, diarrhea, nausea and vomiting.  Genitourinary:  Negative for dysuria, flank pain, frequency, hematuria and urgency.  Musculoskeletal:  Negative for falls, joint pain and myalgias.  Skin:  Positive for itching and rash.  Neurological:  Negative for dizziness, tingling, speech change, weakness and headaches.  Endo/Heme/Allergies:  Negative for polydipsia. Does not bruise/bleed easily.  Psychiatric/Behavioral:  Negative for depression and memory loss. The patient is nervous/anxious. The patient does not have insomnia.      Objective:  BP 118/74   Pulse 71   Ht 5\' 6"  (1.676 m)   Wt 161 lb 3.2 oz (73.1 kg)   SpO2 97%   BMI 26.02 kg/m   General appearance: alert, no distress, WD/WN, African American male Skin: Scattered  darker coloration throughout the mid to upper back and some acne comedones, some darker coloration from prior acne bumps, mild acne of face, some rough skin between fingers but otherwise no significant rash elsewhere and no obvious hair loss or hair thinning HEENT: normocephalic, conjunctiva/corneas normal, sclerae anicteric, PERRLA, EOMi, nares patent, no discharge or erythema, pharynx normal Oral cavity: MMM, tongue normal, teeth in good repair Neck: supple, no lymphadenopathy, no thyromegaly, no masses, normal ROM, no bruits Chest: non tender, normal shape and expansion Heart: RRR, normal S1, S2, no murmurs Lungs: CTA bilaterally, no wheezes, rhonchi, or rales Abdomen: +bs, soft, non tender, non distended, no masses, no hepatomegaly, no splenomegaly, no bruits Back: non tender, normal ROM, no scoliosis Musculoskeletal: upper extremities non tender, no obvious deformity, normal ROM throughout, lower extremities non tender, no obvious deformity, normal ROM throughout Extremities: no edema, no cyanosis, no clubbing Pulses: 2+ symmetric, upper and lower extremities, normal cap refill Neurological: alert, oriented x 3, CN2-12 intact, strength normal upper extremities and lower extremities, sensation normal throughout, DTRs 2+ throughout, no cerebellar signs, gait normal Psychiatric: normal affect, behavior normal, pleasant  GU/rectal - deferred, declined     Assessment and Plan :   Encounter Diagnoses  Name Primary?   Annual physical exam Yes   Need for Tdap vaccination    Screening for lipid disorders    Skin lesion    Encounter for vitamin deficiency screening    Screen for STD (sexually transmitted disease)    Vaccine counseling     Safety Zone Portal Incident: This visit was uneventful until he had a blood draw today.  He was feeling absolutely fine before having the blood drawn.   While the phlebotomist was drawing his blood today he felt faint and suddenly passed out.   Unfortunately he fell off the exam table all the way down to the ground despite mother and phlebotomist both reacting to help avoid the fall.  He sustained a laceration to his right forehead.  Phlebotomist yelled out and several of [place] immediately went into the room to assist.  We held him in position on the floor temporarily.  He immediately came to consciousness but seemed a little confused at first.  Within about 15 seconds he was coherent and answering questions appropriately.  I did some initial examination.  Initially he had no neck pain, is moving all extremities, did not feel any numbness or tingling or weakness.  Over the next 15 minutes we let him know so to rest in place and then gradually as he was feeling fine we moved to the sitting position.  After short period time feeling okay we gradually got him back up on the exam table off the hard floor and had his mother sit right beside him to make sure he did not fall.  He sat in a reclined position and  we continue to monitor him every 5 minutes for the next period of time.  Over the course of the next 20 to 30 minutes he did start to complain of dizziness, headache, tenderness at the site of the laceration and started complaining of some lateral neck pain on the right.  Given his ongoing symptoms I felt he needed to be evaluated further as it would not be surprising that he has a concussion.  I want to also make sure the C-spine ok.  I called and spoke to supervising physician Dr. Susann GivensLalonde and the office manager Suzanna Obeyiane Turner.  We are made a decision to have him be seen in the emergency department.  Mom wanted to take him the first vehicle.  At the time of discharge, he was coherent and talking and oriented x 3 but he was feeling dizzy and had a headache.    She will report there now.      Of note, other than the incident that occurred after phlebotomy, he was here for a well visit.   This visit was a preventative care visit, also known as  wellness visit or routine physical.   Topics typically include healthy lifestyle, diet, exercise, preventative care, vaccinations, sick and well care, proper use of emergency dept and after hours care, as well as other concerns.     Separate significant issues discussed: Anxiety concerns, social anxiety concerns-referral to counseling  Skin concerns, dry skin, acne-labs today but will likely call back with other recommendations.  Advised healthy diet, sun exposure to help with vitamin D absorption, moisturizing lotion daily.  General Recommendations: Continue to return yearly for your annual wellness and preventative care visits.  This gives us a chance to discuss healthy lifestyle, exercise, vaccinations, review your chart record, and perform screenings where appropriate.  I recommend you see your eye doctor yearly for routine vision care.  I recommend you see your dentist yearly for routine dental care including hygiene visits twice yearly.   Vaccination  Immunization History  Administered Date(s) Administered   DTaP 11/11/1999, 02/24/2000, 04/26/2000, 01/25/2001, 06/04/2004   HIB (PRP-OMP) 11/11/1999, 02/24/2000, 01/25/2001   HPV 9-valent 02/11/2012, 08/02/2014   Hepatitis A 08/09/2007, 08/13/2008   Hepatitis B 11/11/1999, 02/24/2000, 01/25/2001   IPV 11/11/1999, 02/24/2000, 01/25/2001, 06/04/2004   Influenza-Unspecified 10/27/2005, 10/11/2009   MMR 11/23/2000, 06/04/2004   Meningococcal Conjugate 01/14/2011   PFIZER(Purple Top)SARS-COV-2 Vaccination 12/20/2020   Pneumococcal Conjugate-13 11/11/1999, 02/24/2000, 04/26/2000, 01/25/2001   Td 01/14/2011   Tdap 01/14/2011   Varicella 11/23/2000, 08/09/2007    Counseled on the Tdap (tetanus, diptheria, and acellular pertussis) vaccine.  Vaccine information sheet given. Tdap vaccine given after consent obtained.   Screening for cancer: Colon cancer screening: Age 24  Testicular cancer screening You should do a monthly self  testicular exam if you are between 8320-17 years old, and we typically do a testicular exam on the yearly physical for this same age group.   Prostate Cancer screening: The recommended prostate cancer screening test is a blood test called the prostate-specific antigen (PSA) test. PSA is a protein that is made in the prostate. As you age, your prostate naturally produces more PSA. Abnormally high PSA levels may be caused by: Prostate cancer. An enlarged prostate that is not caused by cancer (benign prostatic hyperplasia, or BPH). This condition is very common in older men. A prostate gland infection (prostatitis) or urinary tract infection. Certain medicines such as male hormones (like testosterone) or other medicines that raise testosterone levels. A rectal exam  may be done as part of prostate cancer screening to help provide information about the size of your prostate gland. When a rectal exam is performed, it should be done after the PSA level is drawn to avoid any effect on the results.   Skin cancer screening: Check your skin regularly for new changes, growing lesions, or other lesions of concern Come in for evaluation if you have skin lesions of concern.   Lung cancer screening: If you have a greater than 20 pack year history of tobacco use, then you may qualify for lung cancer screening with a chest CT scan.   Please call your insurance company to inquire about coverage for this test.   Pancreatic cancer:  no current screening test is available or routinely recommended. (risk factors: smoking, overweight or obese, diabetes, chronic pancreatitis, work exposure - dry cleaning, metal working, 24yo>, M>F, Tree surgeonAfrican American, family hx/o, hereditary breast, ovarian, melanoma, lynch, peutz-jeghers).  Symptoms: jaundice, dark urine, light color or greasy stools, itchy skin, belly or back pain, weight loss, poor appetite, nause, vomiting, liver enlargement, DVT/blood clots.   We currently don't  have screenings for other cancers besides breast, cervical, colon, and lung cancers.  If you have a strong family history of cancer or have other cancer screening concerns, please let me know.  Genetic testing referral is an option for individuals with high cancer risk in the family.  There are some other cancer screenings in development currently.   Bone health: Get at least 150 minutes of aerobic exercise weekly Get weight bearing exercise at least once weekly   Heart health: Get at least 150 minutes of aerobic exercise weekly Limit alcohol It is important to maintain a healthy blood pressure and healthy cholesterol numbers  Heart disease screening: Screening for heart disease includes screening for blood pressure, fasting lipids, glucose/diabetes screening, BMI height to weight ratio, reviewed of smoking status, physical activity, and diet.    Goals include blood pressure 120/80 or less, maintaining a healthy lipid/cholesterol profile, preventing diabetes or keeping diabetes numbers under good control, not smoking or using tobacco products, exercising most days per week or at least 150 minutes per week of exercise, and eating healthy variety of fruits and vegetables, healthy oils, and avoiding unhealthy food choices like fried food, fast food, high sugar and high cholesterol foods.      Keylon was seen today for annual exam.  Diagnoses and all orders for this visit:  Annual physical exam -     POCT Urinalysis DIP (Proadvantage Device) -     Comprehensive metabolic panel -     CBC -     Lipid panel -     TSH -     VITAMIN D 25 Hydroxy (Vit-D Deficiency, Fractures) -     RPR+HIV+GC+CT Panel -     Hepatitis C antibody -     Hepatitis B surface antigen  Need for Tdap vaccination  Screening for lipid disorders  Skin lesion -     TSH -     VITAMIN D 25 Hydroxy (Vit-D Deficiency, Fractures)  Encounter for vitamin deficiency screening -     VITAMIN D 25 Hydroxy (Vit-D  Deficiency, Fractures)  Screen for STD (sexually transmitted disease) -     RPR+HIV+GC+CT Panel -     Hepatitis C antibody -     Hepatitis B surface antigen  Vaccine counseling   Follow-up pending labs, yearly for physical

## 2023-03-18 NOTE — ED Provider Notes (Signed)
[facility]   CSN: [id number] Arrival date & time: 03/18/23  1318     History  Chief Complaint  Patient presents with   Loss of Consciousness    Scott [name] is a 22 y.o. male, no pertinent past medical history, who presents to the ED secondary to a syncopal episode he had at the doctor's office today.  He states that he was about to get a blood draw, when he passed out and landed on the right side of his body.  He reports that he has some elbow pain, and his neck hurts, he has a slight headache.  Denies any nausea or vomiting.  Not on any blood thinners.  Loss of consciousness maybe 10 seconds.  Witnessed.  Denies any other pain.     Home Medications Prior to Admission medications   Medication Sig Start Date End Date Taking? Authorizing Provider  famotidine (PEPCID) 40 MG tablet Take 1 tablet (40 mg total) by mouth daily. 10/06/22   Tysinger, Kermit Baloavid S, PA-C  ketoconazole (NIZORAL) 2 % shampoo APPLY TOPICALLY 2 TIMES A WEEK 10/06/22   Tysinger, Kermit Baloavid S, PA-C  pantoprazole (PROTONIX) 40 MG tablet Take 1 tablet (40 mg total) by mouth daily. 10/06/22   Tysinger, Kermit Baloavid S, PA-C      Allergies    Patient has no known allergies.    Review of Systems   Review of Systems  Cardiovascular:  Positive for syncope.  Musculoskeletal:  Positive for neck pain. Negative for back pain.    Physical Exam Updated Vital Signs BP 124/79 (BP Location: Right Arm)   Pulse (!) 58   Temp 98.2 F (36.8 C)   Resp 14   SpO2 100%  Physical Exam Vitals and nursing note reviewed.  Constitutional:      General: He is not in acute distress.    Appearance: He is well-developed.  HENT:     Head: Normocephalic and atraumatic.     Right Ear: Tympanic membrane normal.     Left Ear: Tympanic membrane normal.     Nose:     Comments: No septal hematoma.  No step-offs.  No tenderness to palpation of the face.    Mouth/Throat:     Comments: No blood  in oropharynx or missing teeth. Eyes:     Conjunctiva/sclera: Conjunctivae normal.  Neck:     Comments: Mild tenderness to palpation of the right-sided neck. Cardiovascular:     Rate and Rhythm: Normal rate and regular rhythm.     Heart sounds: No murmur heard. Pulmonary:     Effort: Pulmonary effort is normal. No respiratory distress.     Breath sounds: Normal breath sounds.  Abdominal:     Palpations: Abdomen is soft.     Tenderness: There is no abdominal tenderness.  Musculoskeletal:        General: No swelling.     Cervical back: Neck supple.     Comments: Tenderness to palpation olecranon of right arm.  No step-offs noted.  No tenderness to palpation of humerus or radius/ulna.  Grip strength intact.  5/5 strength of bilateral upper and bilateral lower extremities.  Skin:    General: Skin is warm and dry.     Capillary Refill: Capillary refill takes less than 2 seconds.     Comments: 1 cm laceration to right temple  Neurological:     Mental Status: He is alert.  Psychiatric:        Mood and Affect:  Mood normal.     ED Results / Procedures / Treatments   Labs (all labs ordered are listed, but only abnormal results are displayed) Labs Reviewed - No data to display  EKG None  Radiology DG Elbow Complete Right  Result Date: 03/18/2023 CLINICAL DATA:  Trauma, fall EXAM: RIGHT ELBOW - COMPLETE 3+ VIEW COMPARISON:  07/10/2008 FINDINGS: No recent fracture or dislocation is seen. There is no displacement of posterior fat pad. There are no opaque foreign bodies. There is mild subcutaneous edema. IMPRESSION: No recent fracture or dislocation is seen right elbow. Electronically Signed   By: Ernie Avena M.D.   On: 03/18/2023 15:49   CT Head Wo Contrast  Result Date: 03/18/2023 CLINICAL DATA:  Syncope EXAM: CT HEAD WITHOUT CONTRAST CT CERVICAL SPINE WITHOUT CONTRAST TECHNIQUE: Multidetector CT imaging of the head and cervical spine was performed following the standard protocol  without intravenous contrast. Multiplanar CT image reconstructions of the cervical spine were also generated. RADIATION DOSE REDUCTION: This exam was performed according to the departmental dose-optimization program which includes automated exposure control, adjustment of the mA and/or kV according to patient size and/or use of iterative reconstruction technique. COMPARISON:  None Available. FINDINGS: Brain: No evidence of acute infarction, hemorrhage, hydrocephalus, extra-axial collection or mass lesion/mass effect. Vascular: No hyperdense vessel or unexpected calcification. Skull: Normal. Negative for fracture or focal lesion. Sinuses/Orbits: No acute finding. CT CERVICAL SPINE FINDINGS Alignment: Normal. Skull base and vertebrae: No acute fracture. No primary bone lesion or focal pathologic process. Soft tissues and spinal canal: No prevertebral fluid or swelling. No visible canal hematoma. Disc levels:  Maintained Upper chest: Negative. IMPRESSION: 1. No acute intracranial process. 2. Unremarkable examination of the cervical spine. Electronically Signed   By: Layla Maw M.D.   On: 03/18/2023 14:27   CT Cervical Spine Wo Contrast  Result Date: 03/18/2023 CLINICAL DATA:  Syncope EXAM: CT HEAD WITHOUT CONTRAST CT CERVICAL SPINE WITHOUT CONTRAST TECHNIQUE: Multidetector CT imaging of the head and cervical spine was performed following the standard protocol without intravenous contrast. Multiplanar CT image reconstructions of the cervical spine were also generated. RADIATION DOSE REDUCTION: This exam was performed according to the departmental dose-optimization program which includes automated exposure control, adjustment of the mA and/or kV according to patient size and/or use of iterative reconstruction technique. COMPARISON:  None Available. FINDINGS: Brain: No evidence of acute infarction, hemorrhage, hydrocephalus, extra-axial collection or mass lesion/mass effect. Vascular: No hyperdense vessel or  unexpected calcification. Skull: Normal. Negative for fracture or focal lesion. Sinuses/Orbits: No acute finding. CT CERVICAL SPINE FINDINGS Alignment: Normal. Skull base and vertebrae: No acute fracture. No primary bone lesion or focal pathologic process. Soft tissues and spinal canal: No prevertebral fluid or swelling. No visible canal hematoma. Disc levels:  Maintained Upper chest: Negative. IMPRESSION: 1. No acute intracranial process. 2. Unremarkable examination of the cervical spine. Electronically Signed   By: Layla Maw M.D.   On: 03/18/2023 14:27    Procedures .Marland KitchenLaceration Repair  Date/Time: 03/18/2023 4:27 PM  Performed by: Pete Pelt, PA Authorized by: Pete Pelt, PA   Consent:    Consent obtained:  Verbal   Consent given by:  Patient   Risks, benefits, and alternatives were discussed: yes     Risks discussed:  Infection, pain, poor cosmetic result, need for additional repair and poor wound healing   Alternatives discussed:  Delayed treatment Universal protocol:    Patient identity confirmed:  Verbally with patient Anesthesia:    Anesthesia method:  None Laceration details:    Location:  Face   Face location:  Forehead   Length (cm):  1 Exploration:    Limited defect created (wound extended): yes   Treatment:    Area cleansed with:  Chlorhexidine   Amount of cleaning:  Standard Skin repair:    Repair method:  Tissue adhesive Approximation:    Approximation:  Close     Medications Ordered in ED Medications  Tdap (BOOSTRIX) injection 0.5 mL (0.5 mLs Intramuscular Given 03/18/23 1515)  naproxen (NAPROSYN) tablet 500 mg (500 mg Oral Given 03/18/23 1514)    ED Course/ Medical Decision Making/ A&P                             Medical Decision Making Patient is a 22 year old male, with a syncopal episode that occurred today prior to having his blood taken.  He does not like seeing needles.  He has not been feeling sick, and only complains of some neck pain,  headache, and loss of consciousness.  Along with right-sided elbow pain.  Will obtain x-ray of his elbow, update his tetanus shot given the laceration and exposure to metal.  He has no neurodeficits on exam, triage already ordered head CT and neck CT.  Overall well-appearing.  I discussed blood work, with patient he declined at this time.  Amount and/or Complexity of Data Reviewed Radiology: ordered.    Details: Unremarkable Discussion of management or test interpretation with external provider(s): Laceration repair, x-ray number unremarkable, CT head/neck unremarkable.  No acute findings, he is neurologically intact has no deficits, and is overall well-appearing.  We discussed blood work and he declined at this time.  I believe that his syncopal episode is likely secondary to vasovagal from the anxiety invoking aspect of the blood draw.  We discussed return precautions, follow-up with PCP.  Tetanus updated laceration repaired.  Risk Prescription drug management.    Final Clinical Impression(s) / ED Diagnoses Final diagnoses:  Syncope, unspecified syncope type  Laceration of head without foreign body, unspecified part of head, initial encounter  Injury of head, initial encounter    Rx / DC Orders ED Discharge Orders     None         Linzy Darling, Harley AltoBrooke L, PA 03/18/23 1629    Derwood KaplanNanavati, Ankit, MD 03/19/23 0930

## 2023-03-18 NOTE — Discharge Instructions (Addendum)
Please follow-up with your primary care doctor, your head CT and your neck CT were unremarkable.  Your wound will not heal on its own, and the glue will fall off by itself.  If it becomes red swollen return to the ER.  If you have intractable nausea, pain, confusion please return to the ER.`

## 2023-03-21 ENCOUNTER — Other Ambulatory Visit: Payer: Self-pay | Admitting: Medical

## 2023-03-21 LAB — LIPID PANEL
Chol/HDL Ratio: 2.7 ratio (ref 0.0–5.0)
Cholesterol, Total: 131 mg/dL (ref 100–199)
HDL: 48 mg/dL (ref >39)
LDL Chol Calc (NIH): 70 mg/dL (ref 0–99)
Triglycerides: 59 mg/dL (ref 0–149)
VLDL Cholesterol Cal: 13 mg/dL (ref 5–40)

## 2023-03-21 LAB — COMPREHENSIVE METABOLIC PANEL
ALT: 16 IU/L (ref 0–44)
AST: 17 IU/L (ref 0–40)
Albumin/Globulin Ratio: 1.5 (ref 1.2–2.2)
Albumin: 4.8 g/dL (ref 4.3–5.2)
Alkaline Phosphatase: 97 IU/L (ref 44–121)
BUN/Creatinine Ratio: 13 (ref 9–20)
BUN: 14 mg/dL (ref 6–20)
Bilirubin Total: 0.5 mg/dL (ref 0.0–1.2)
CO2: 23 mmol/L (ref 20–29)
Calcium: 10.3 mg/dL — ABNORMAL HIGH (ref 8.7–10.2)
Chloride: 100 mmol/L (ref 96–106)
Creatinine, Ser: 1.04 mg/dL (ref 0.76–1.27)
Globulin, Total: 3.3 g/dL (ref 1.5–4.5)
Glucose: 97 mg/dL (ref 70–99)
Potassium: 4.1 mmol/L (ref 3.5–5.2)
Sodium: 139 mmol/L (ref 134–144)
Total Protein: 8.1 g/dL (ref 6.0–8.5)
eGFR: 103 mL/min/{1.73_m2} (ref >59)

## 2023-03-21 LAB — HEPATITIS B SURFACE ANTIGEN: Hepatitis B Surface Ag: NEGATIVE

## 2023-03-21 LAB — CBC
Hematocrit: 47.9 % (ref 37.5–51.0)
Hemoglobin: 16.1 g/dL (ref 13.0–17.7)
MCH: 28.8 pg (ref 26.6–33.0)
MCHC: 33.6 g/dL (ref 31.5–35.7)
MCV: 86 fL (ref 79–97)
Platelets: 219 10*3/uL (ref 150–450)
RBC: 5.6 x10E6/uL (ref 4.14–5.80)
RDW: 12.4 % (ref 11.6–15.4)
WBC: 6.7 10*3/uL (ref 3.4–10.8)

## 2023-03-21 LAB — RPR+HIV+GC+CT PANEL
Chlamydia trachomatis, NAA: NEGATIVE
HIV Screen 4th Generation wRfx: NONREACTIVE
Neisseria Gonorrhoeae by PCR: NEGATIVE
RPR Ser Ql: NONREACTIVE

## 2023-03-21 LAB — TSH: TSH: 0.689 u[IU]/mL (ref 0.450–4.500)

## 2023-03-21 LAB — HEPATITIS C ANTIBODY: Hep C Virus Ab: NONREACTIVE

## 2023-03-21 LAB — VITAMIN D 25 HYDROXY (VIT D DEFICIENCY, FRACTURES): Vit D, 25-Hydroxy: 12.3 ng/mL — ABNORMAL LOW (ref 30.0–100.0)

## 2023-03-21 MED ORDER — VITAMIN D 50 MCG (2000 UT) PO CAPS: 1.0000 | ORAL_CAPSULE | Freq: Every day | ORAL | 0 refills | Status: DC

## 2023-03-21 NOTE — Progress Notes (Signed)
Call and see how he is doing today.  I did review the emergency department note and glad that there was no obvious bleed or anything major.  I hope he has no headaches or any ongoing symptoms  Vitamin D level is low. I would like you to begin prescription Vitamin D 2000 IU daily.  We will plan to check vitamin D periodically along with calcium, such as repeating labs in 3 months.  Foods that contain vitamin D include seafood such as oysters, shrimp, salmon, herring, cod, egg yolks, mushrooms, milk, and foods fortified with Vitamin D such as orange juice, cereals.  Its also important to get some sun exposure regularly to absorb vitamin D.  Glucose kidney and liver are okay.  Calcium was slightly elevated.  Blood counts normal, cholesterol normal, thyroid normal.  Negative hepatitis B and C, negative for HIV and syphilis.  Still pending gonorrhea and Chlamydia test.  We can plan to recheck the calcium and vitamin D levels in 3 months

## 2023-03-22 NOTE — Progress Notes (Signed)
Results sent through MyChart

## 2023-05-03 ENCOUNTER — Ambulatory Visit (INDEPENDENT_AMBULATORY_CARE_PROVIDER_SITE_OTHER): Payer: BC Managed Care – PPO | Admitting: Psychology

## 2023-05-03 DIAGNOSIS — F418 Other specified anxiety disorders: Secondary | ICD-10-CM | POA: Diagnosis not present

## 2023-05-03 NOTE — Progress Notes (Signed)
[facility] Counselor Initial Adult Exam  Name: Scott [name] Date: 05/03/2023 MRN: [id number] DOB: May 08, 2001 PCP: Jac Canavan, PA-C  Time Spent: 10:04  am - 10:50 am : 46 Minutes  Guardian/Payee:  self    Paperwork requested: No   Reason for Visit /Presenting Problem: Depression and anxiety.   Mental Status Exam: Appearance:   Casual     Behavior:  Appropriate  Motor:  Normal  Speech/Language:   Clear and Coherent  Affect:  Flat  Mood:  dysthymic  Thought process:  normal  Thought content:    WNL  Sensory/Perceptual disturbances:    WNL  Orientation:  oriented to person, place, time/date, and situation  Attention:  Good  Concentration:  Good  Memory:  WNL  Fund of knowledge:   Good  Insight:    Good  Judgment:   Good  Impulse Control:  Good   Reported Symptoms:  Anxiety & depression.   Risk Assessment: Danger to Self:  No Self-injurious Behavior: No Danger to Others: No Duty to Warn:no Physical Aggression / Violence:No  Access to Firearms a concern: No  Gang Involvement:No  Patient / guardian was educated about steps to take if suicide or homicide risk level increases between visits: no While future psychiatric events cannot be accurately predicted, the patient does not currently require acute inpatient psychiatric care and does not currently meet [facility] involuntary commitment criteria.  Substance Abuse History: Current substance abuse: No     Caffeine: None.  Tobacco: None. Alcohol: None. Substance use: None.   Past Psychiatric History:   No previous psychological problems have been observed Outpatient Providers: No x.  History of Psych Hospitalization: No  Psychological Testing:  NA   Family History: Mother - Anxiety.   Abuse History:  Victim of: No.,  na    Report needed: No. Victim of Neglect:No. Perpetrator of  na   Witness / Exposure to Domestic Violence: No   Protective Services Involvement: No  Witness to  [employer] Violence:  No   Family History:  Family History  Problem Relation Age of Onset   Anxiety disorder Mother    Hypertension Mother    Arrhythmia Mother    Colon cancer Neg Hx    Esophageal cancer Neg Hx    Pancreatic cancer Neg Hx    Stomach cancer Neg Hx    Liver disease Neg Hx     Living situation: the patient lives with their family (mother and sister). Older brother lives with friends.   Sexual Orientation: Gay  Relationship Status: single. Was in a relationship ~5 years ago. Dated Jalen in high school. Jalen broke up with him after Edd walked into the door and was embarrassed and discontinued contact for a week.  Name of spouse / other: n/a If a parent, number of children / ages:n/a  Support Systems: Mom.   Financial Stress:  No   Income/Employment/Disability: No income. 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   Military Service: No   Educational History: Education: high school diploma/GED  Religion/Sprituality/World View: None  Any cultural differences that may affect / interfere with treatment:  not applicable   Recreation/Hobbies: Video Games (roblox) and Music (all kinds)  Stressors: Other: video-games and lack of progress, loud noises (?), News and political events    Strengths: Video-games, cooking, fixing electronics.   Barriers:  mood.    Legal History: Pending legal issue / charges: The patient has no significant history of legal issues. History  of legal issue / charges:  NA  Medical History/Surgical History: reviewed Past Medical History:  Diagnosis Date   Acid reflux    Anxiety     Past Surgical History:  Procedure Laterality Date   ESOPHAGEAL DILATION  06/2021   ESOPHAGOGASTRODUODENOSCOPY  06/2021    Medications: Current Outpatient Medications  Medication Sig Dispense Refill   Cholecalciferol (VITAMIN D) 50 MCG (2000 UT) CAPS Take 1 capsule (2,000 Units total) by mouth daily. 90 capsule 0    famotidine (PEPCID) 40 MG tablet Take 1 tablet (40 mg total) by mouth daily. 90 tablet 1   ketoconazole (NIZORAL) 2 % shampoo APPLY TOPICALLY 2 TIMES A WEEK 120 mL 6   pantoprazole (PROTONIX) 40 MG tablet Take 1 tablet (40 mg total) by mouth daily. 90 tablet 1   No current facility-administered medications for this visit.    No Known Allergies  Diagnoses:  Other specified anxiety disorders  Psychiatric Treatment: No , na   Plan of Care: Outpatient therapy.   Narrative:  [name] participated from office with therapist and consented to treatment. We reviewed the limits of confidentiality prior to the start of the evaluation. [name] expressed understanding and agreement to proceed. He was referred by his PCP due to symptoms of anxiety. Needle anxiety specifically "seeing blood drawn and feeling if it being taken out". He noted anxiety related to talking to new people, reading news online (election), video-games. Japheth has a history of panic ~ 2.5 years ago but denied any panic since that time. His anxiety symptoms include feeling nervous, difficulty managing worry, worrying about different things, trouble relaxing, irritability, feeling afraid something awful happen. His depression symptoms including loss of interest, difficulty falling asleep and hypersomnia (~9-10), lethargy, hyper-motor agitation. He presented with a flat affect. He noted being less anxious during high school but noted being an introvert which affects his ability to start conversations. He noted his interest in addressing his anxiety via therapy. He noted a need to work on being more relaxed. He noted having numerous goals including leaving in his own place, driving, and going to college for engineering. He noted barriers to all three goals include anxiety. He appears to benefit from counseling to address symptoms, bolster coping skills, and process past events. Nash presented as guarded but was engaged during  the session. He would benefit from improving his self-esteem. We completed the ASRS v.1.1 and it was not a positive screening. Additional evaluation of symptoms is warranted. Zepplin will work on scheduling a follow-up beginning with creating a treatment plan. Therapist answered any and all questions during the evaluation.    ASRS V1.1  Part-A 1. How often do you have trouble wrapping up the final details of a project,     once the challenging parts have been done? Sometimes 2. How often do you have difficulty getting things in order when you have to do     a task that requires organization? Rarely 3. How often do you have problems remembering appointments or obligations? Rarely 4. When you have a task that requires a lot of thought, how often do you avoid     or delay getting started? Sometimes 5. How often do you fidget or squirm with your hands or feet when you have     to sit down for a long time? Very Often 6. How often do you feel overly active and compelled to do things, like you     were driven by a motor? Rarely  Part-B 7. How often do you make careless mistakes when you have to work on a boring or     difficult project? Sometimes 8. How often do you have difficulty keeping your attention when you are doing boring     or repetitive work? Often 9. How often do you have difficulty concentrating on what people say to you,      even when they are speaking to you directly? Sometimes 10. How often do you misplace or have difficulty finding things at home or at work? Often 11. How often are you distracted by activity or noise around you? Rarely 12. How often do you leave your seat in meetings or other situations in which       you are expected to remain seated? Rarely 13. How often do you feel restless or fidgety? Sometimes 14. How often do you have difficulty unwinding and relaxing when you have time       to yourself? Often 15. How often do you find yourself talking too much when  you are in social situations? Rarely 16. When you're in a conversation, how often do you find yourself finishing           the sentences of the people you are talking to, before they can finish       them themselves? Rarely 17. How often do you have difficulty waiting your turn in situations when       turn taking is required? Never 18. How often do you interrupt others when they are busy? Rarely    Delight Ovens, LCSW

## 2023-05-18 ENCOUNTER — Other Ambulatory Visit: Payer: Self-pay | Admitting: Medical

## 2023-05-31 ENCOUNTER — Ambulatory Visit: Payer: BC Managed Care – PPO | Admitting: Psychology

## 2023-06-06 ENCOUNTER — Ambulatory Visit (INDEPENDENT_AMBULATORY_CARE_PROVIDER_SITE_OTHER): Payer: BC Managed Care – PPO | Admitting: Psychology

## 2023-06-06 DIAGNOSIS — F418 Other specified anxiety disorders: Secondary | ICD-10-CM

## 2023-06-06 NOTE — Progress Notes (Signed)
[facility] Counselor/Therapist Progress Note  Patient ID: Scott Scott [name], MRN: [id number]   Date: 06/06/23  Time Spent: 1:05  pm - 1:52 pm : 47 Minutes  Treatment Type: Individual Therapy.  Reported Scott [name]: Scott [name] & depression.   Mental Status Exam: Appearance:  Casual     Behavior: Appropriate  Motor: Normal  Speech/Language:  Clear and Coherent  Affect: Flat  Mood: normal  Thought Scott: normal  Thought content:   WNL  Sensory/Perceptual disturbances:   WNL  Orientation: oriented to person, place, time/date, and situation  Attention: Good  Concentration: Good  Memory: WNL  Fund of knowledge:  Good  Insight:   Good  Judgment:  Good  Impulse Control: Good   Risk Assessment: Danger to Self:  No Self-injurious Behavior: No Danger to Others: No Duty to Warn:no Physical Aggression / Violence:No  Access to Firearms a concern: No  Gang Involvement:No   Subjective:   Scott Scott [name] participated from home, via video, is aware of the limitations of tele-session, and consented to treatment. Therapist participated from office. I discussed the limitations of evaluation and management by telemedicine and the availability of in person appointments. The patient expressed understanding and agreed to proceed. Scott Scott [name] the Scott [name] of the past week. Therapist Scott [name] Scott Scott [name], to review, between sessions, and to complete activity log to complete between sessions.  We Scott [name] numerous treatment approaches including CBT, BA, Problem Solving, and Solution focused therapy. Psych-education regarding the Scott [name]'s diagnosis of Scott Scott [name] was Scott [name] during the session. We discussed Scott Scott [name]'s goals treatment goals which include  social Scott [name], Scott Scott [name], Scott Scott [name], Scott Scott [name], Scott Scott [name], Scott Scott [name], Scott Scott [name], Scott Scott [name], Scott Scott [name]. Scott Scott [name] verbal approval of the treatment plan.   Interventions: Psycho-education & Goal Setting.   Diagnosis:  Scott Scott [name]  Psychiatric Treatment: No , but would benefit from.   Treatment Plan:  Client Abilities/Strengths Scott Scott [name] is self-aware and motivated for change.   Support System: Family  Client Treatment Preferences OPT  Client Statement of Needs Scott Scott [name], Scott Scott [name], Scott Scott [name], Scott Scott [name], Scott Scott [name], Scott Scott [name], Scott Scott [name], Scott Scott [name], Scott Scott [name].   Treatment Level Weekly  Scott [name]  Scott [name]: feeling nervous, difficulty Scott worry, worrying about different things, trouble relaxing, irritability, feeling afraid something awful happen    (Status: maintained) Depression: loss of interest, difficulty falling asleep and hypersomnia (~9-10), lethargy, hyper-motor agitation. He presented with a flat affect. (Status: maintained)  Goals:   Scott Scott [name] experiences Scott [name] of depression and Scott [name].   Treatment plan signed and available on s-drive:  No, pending signature.    Target Date: 06/05/24 Frequency: Weekly  Progress: 0 Modality: individual    Therapist will provide referrals for additional resources as appropriate.  Therapist will provide psycho-education regarding Scott [name]'s diagnosis and corresponding treatment approaches and interventions. Licensed Clinical Social Worker, [place], LCSW will support the patient's ability to achieve the goals identified. will employ CBT, BA, Problem-solving, Solution Focused, Mindfulness,  coping skills, & Scott evidenced-based practices will be used to promote progress towards healthy functioning to help Scott decrease Scott [name] associated with his diagnosis.   Reduce overall level, frequency, and intensity of the Scott [name] of depression, Scott [name] and panic evidenced by decreased overall  Scott [name] from 6 to 7 days/week to 0 to 1 days/week per client report for at least 3 consecutive months. Verbally express understanding  of the relationship between Scott [name] of depression, Scott [name] and their impact on thinking patterns and behaviors. Verbalize an understanding of the role that distorted thinking plays in creating fears, excessive worry, and ruminations.    Scott Scott [name] participated in the creation of the treatment plan)    Scott Ovens, LCSW

## 2023-06-20 ENCOUNTER — Ambulatory Visit: Payer: BC Managed Care – PPO | Admitting: Medical

## 2023-06-20 VITALS — BP 120/78 | HR 64 | Wt 166.2 lb

## 2023-06-20 DIAGNOSIS — E559 Vitamin D deficiency, unspecified: Secondary | ICD-10-CM

## 2023-06-20 NOTE — Progress Notes (Signed)
Subjective:  Scott [name] is a 22 y.o. male who presents for Chief Complaint  Patient presents with   Medical Management of Chronic Issues    Follow-up on vitamin D     Here for f/u on low vitamin D from 03/2023 physical and labs.   Does eat a good amount of fish.  Not so good about sun exposure.  Started vitamin D 2000 units daily since 03/2023 and is good about compliance.  No other aggravating or relieving factors.    No other c/o.  Past Medical History:  Diagnosis Date   Acid reflux    Anxiety    Current Outpatient Medications on File Prior to Visit  Medication Sig Dispense Refill   Cholecalciferol (VITAMIN D) 50 MCG (2000 UT) CAPS Take 1 capsule (2,000 Units total) by mouth daily. 90 capsule 0   famotidine (PEPCID) 40 MG tablet TAKE 1 TABLET(40 MG) BY MOUTH DAILY 90 tablet 1   ketoconazole (NIZORAL) 2 % shampoo APPLY TOPICALLY 2 TIMES A WEEK 120 mL 6   pantoprazole (PROTONIX) 40 MG tablet TAKE 1 TABLET(40 MG) BY MOUTH DAILY 90 tablet 1   No current facility-administered medications on file prior to visit.    The following portions of the patient's history were reviewed and updated as appropriate: allergies, current medications, past family history, past medical history, past social history, past surgical history and problem list.  ROS Otherwise as in subjective above  Objective: BP 120/78   Pulse 64   Wt 166 lb 3.2 oz (75.4 kg)   BMI 26.83 kg/m   Wt Readings from Last 3 Encounters:  06/20/23 166 lb 3.2 oz (75.4 kg)  03/18/23 161 lb 3.2 oz (73.1 kg)  10/06/22 169 lb 9.6 oz (76.9 kg)    General appearance: alert, no distress, well developed, well nourished    Assessment: Encounter Diagnosis  Name Primary?   Vitamin D deficiency Yes     Plan: Counseled on low vitamin D . Labs today since he has been on a few months of vitamin D supplement.    Recheck labs for calcium as well since there was a slight abnormality on labs in 03/2023.  Given that he had a  faint episode last visit in 03/2023 during blood draw, we laid him supine, and took extra precautions for lab draw today. He did fine with labs today   Pryor was seen today for medical management of chronic issues.  Diagnoses and all orders for this visit:  Vitamin D deficiency -     Basic metabolic panel -     VITAMIN D 25 Hydroxy (Vit-D Deficiency, Fractures)    Follow up: pending labs

## 2023-06-21 ENCOUNTER — Other Ambulatory Visit: Payer: Self-pay | Admitting: Medical

## 2023-06-21 LAB — BASIC METABOLIC PANEL
BUN/Creatinine Ratio: 15 (ref 9–20)
BUN: 14 mg/dL (ref 6–20)
CO2: 25 mmol/L (ref 20–29)
Calcium: 9.8 mg/dL (ref 8.7–10.2)
Chloride: 102 mmol/L (ref 96–106)
Creatinine, Ser: 0.92 mg/dL (ref 0.76–1.27)
Glucose: 88 mg/dL (ref 70–99)
Potassium: 3.8 mmol/L (ref 3.5–5.2)
Sodium: 143 mmol/L (ref 134–144)
eGFR: 120 mL/min/{1.73_m2} (ref >59)

## 2023-06-21 LAB — VITAMIN D 25 HYDROXY (VIT D DEFICIENCY, FRACTURES): Vit D, 25-Hydroxy: 58.9 ng/mL (ref 30.0–100.0)

## 2023-06-21 MED ORDER — VITAMIN D 50 MCG (2000 UT) PO CAPS: 1.0000 | ORAL_CAPSULE | Freq: Every day | ORAL | 3 refills | Status: DC

## 2023-06-21 NOTE — Progress Notes (Signed)
Results sent through MyChart

## 2023-07-01 ENCOUNTER — Ambulatory Visit (INDEPENDENT_AMBULATORY_CARE_PROVIDER_SITE_OTHER): Payer: BC Managed Care – PPO | Admitting: Psychology

## 2023-07-01 DIAGNOSIS — F418 Other specified anxiety disorders: Secondary | ICD-10-CM

## 2023-07-01 NOTE — Progress Notes (Signed)
[facility] Counselor/Therapist Progress [name]  Patient ID: Scott [name], MRN: [id number]   Date: 07/01/23  Time Spent: 4:32 pm - 5:11 pm : 39 Minutes  Treatment Type: Individual Therapy.  Reported Symptoms: anxiety & depression.   Mental Status Exam: Appearance:  Casual     Behavior: Appropriate  Motor: Normal  Speech/Language:  Clear and Coherent  Affect: Flat  Mood: normal  Thought process: normal  Thought content:   WNL  Sensory/Perceptual disturbances:   WNL  Orientation: oriented to person, place, time/date, and situation  Attention: Good  Concentration: Good  Memory: WNL  Fund of knowledge:  Good  Insight:   Good  Judgment:  Good  Impulse Control: Good   Risk Assessment: Danger to Self:  No Self-injurious Behavior: No Danger to Others: No Duty to Warn:no Physical Aggression / Violence:No  Access to Firearms a concern: No  Gang Involvement:No   Subjective:   Scott [name] participated from home, via video, is aware of the limitations of tele-session, and consented to treatment. Therapist participated from home office. Scott [name] discussed the events of the past week. We reviewed his activity log that was emailed between sessions. He noted identifying significant time watching YouTube including reaction videos, gaming videos, and animation. He also identifying "weird" sleeping times including staying up late and sleeping during the day. And that his activities don't often shift from day to day. He noted that his sleep schedule was "annoying". He noted interest in adding a varying amount of activities and improve his overall sleep. He noted interest in adding exercise in to his daily routine. Additional interests include baking. Therapist encouraged Scott [name] to create a list of enjoyable or interesting activities to later inject in his day-to-day-routine. He noted his sleep being erratic even during middle and high school. He denied a family history of sleep  concerns. Barriers to sleep include taking his dog out at 12 am, 2 am, and 5 am. Therapist provided psycho-education regarding BA and the importance of creating a schedule, identifying barriers, planning, and using reminders. We worked on practicing gratitude and identifying positive upcoming events. Scott [name] noted a good friend moving back to the area and him looking forward to this. Scott [name] will work on reading more of the previously provided handout regarding BA. Therapist praised Scott [name] for his effort between and during the session and provided supportive therapy. A follow-up was scheduled for continued treatment.   Interventions: BA  Diagnosis:  Other specified anxiety disorders  Psychiatric Treatment: No , but would benefit from.   Treatment Plan:  Client Abilities/Strengths Scott [name] is self-aware and motivated for change.   Support System: Family  Client Treatment Preferences OPT  Client Statement of Needs Scott [name] would like to manage social anxiety, managing anger, improving sleep, manage symptoms, process events, manage distress, engage in self-care, challenge negative thoughts and feelings, develop a day-to-day rhythm.   Treatment Level Weekly  Symptoms  Anxiety: feeling nervous, difficulty managing worry, worrying about different things, trouble relaxing, irritability, feeling afraid something awful happen    (Status: maintained) Depression: loss of interest, difficulty falling asleep and hypersomnia (~9-10), lethargy, hyper-motor agitation. He presented with a flat affect. (Status: maintained)  Goals:   Scott [name] experiences symptoms of depression and anxiety.   Treatment plan signed and available on s-drive:  No, pending signature.    Target Date: 06/05/24 Frequency: Weekly  Progress: 0 Modality: individual    Therapist will provide referrals for additional resources as appropriate.  Therapist will provide psycho-education regarding Scott [name]'s diagnosis and  corresponding  treatment approaches and interventions. Licensed Clinical Social Worker, [place], LCSW will support the patient's ability to achieve the goals identified. will employ CBT, BA, Problem-solving, Solution Focused, Mindfulness,  coping skills, & other evidenced-based practices will be used to promote progress towards healthy functioning to help manage decrease symptoms associated with his diagnosis.   Reduce overall level, frequency, and intensity of the feelings of depression, anxiety and panic evidenced by decreased overall symptoms from 6 to 7 days/week to 0 to 1 days/week per client report for at least 3 consecutive months. Verbally express understanding of the relationship between feelings of depression, anxiety and their impact on thinking patterns and behaviors. Verbalize an understanding of the role that distorted thinking plays in creating fears, excessive worry, and ruminations.    Scott [name] participated in the creation of the treatment plan)    Delight Ovens, LCSW

## 2023-07-15 ENCOUNTER — Ambulatory Visit: Payer: BC Managed Care – PPO | Admitting: Psychology

## 2023-07-15 DIAGNOSIS — F418 Other specified anxiety disorders: Secondary | ICD-10-CM | POA: Diagnosis not present

## 2023-07-15 NOTE — Progress Notes (Signed)
Behavioral Health Counselor/Therapist Progress Note  Patient ID: Scott [name], MRN: [id number]   Date: 07/15/23  Time Spent: 1:33 pm - 2:08 pm : 35 Minutes  Treatment Type: Individual Therapy.  Reported Symptoms: anxiety & depression.   Mental Status Exam: Appearance:  Casual     Behavior: Appropriate  Motor: Normal  Speech/Language:  Clear and Coherent  Affect: Flat  Mood: sad  Thought process: normal  Thought content:   WNL  Sensory/Perceptual disturbances:   WNL  Orientation: oriented to person, place, time/date, and situation  Attention: Good  Concentration: Good  Memory: WNL  Fund of knowledge:  Good  Insight:   Good  Judgment:  Good  Impulse Control: Good   Risk Assessment: Danger to Self:  No Self-injurious Behavior: No Danger to Others: No Duty to Warn:no Physical Aggression / Violence:No  Access to Firearms a concern: No  Gang Involvement:No   Subjective:   Scott [name] participated from home, via video, is aware of the limitations of tele-session, and consented to treatment. Therapist participated from home office. Elisabeth Pigeon discussed the events of the past week. Devian noted this week being stressful due to filling ill, the death of his turtle, and feeling tired. He noted being sad about the loss. He noted some improvement in sleep and discussed making changes to his dog walking schedule, which helped. We discussed ways to create a day-to-day routine. He noted his interest in making breakfast (11am), walking the dog, and listening to music. Therapist employed BA principle to identify  the necessary tools and ingredients to complete the task, scheduling, use of reminder (whiteboard). We worked on identifying barriers and alternative behaviors, if necessary. He noted looking forward to visiting with his friend who recently moved back to [facility]. He noted longer terms goal of driving but discussed not having a drivers license. We worked on identifying possible  steps to practicing to drive. We discussed his grief and ways to process this including journaling. Scott [name] was engaged and motivated during the session and expressed commitment towards goals. Therapist praised [name] and provided supportive therapy. A follow--up was scheduled for continued treatment.   Interventions: BA and Grief counseling.   Diagnosis:  Other specified anxiety disorders  Psychiatric Treatment: No , but would benefit from.   Treatment Plan:  Client Abilities/Strengths Blanton is self-aware and motivated for change.   Support System: Family  Client Treatment Preferences OPT  Client Statement of Needs Rider would like to manage social anxiety, managing anger, improving sleep, manage symptoms, process events, manage distress, engage in self-care, challenge negative thoughts and feelings, develop a day-to-day rhythm.   Treatment Level Weekly  Symptoms  Anxiety: feeling nervous, difficulty managing worry, worrying about different things, trouble relaxing, irritability, feeling afraid something awful happen    (Status: maintained) Depression: loss of interest, difficulty falling asleep and hypersomnia (~9-10), lethargy, hyper-motor agitation. He presented with a flat affect. (Status: maintained)  Goals:   Che experiences symptoms of depression and anxiety.   Treatment plan signed and available on s-drive:  No, pending signature.    Target Date: 06/05/24 Frequency: Weekly  Progress: 0 Modality: individual    Therapist will provide referrals for additional resources as appropriate.  Therapist will provide psycho-education regarding Scott [name]'s diagnosis and corresponding treatment approaches and interventions. Licensed Clinical Social Worker, [place], LCSW will support the patient's ability to achieve the goals identified. will employ CBT, BA, Problem-solving, Solution Focused, Mindfulness,  coping skills, & other evidenced-based practices will be used to  promote progress  towards healthy functioning to help manage decrease symptoms associated with his diagnosis.   Reduce overall level, frequency, and intensity of the feelings of depression, anxiety and panic evidenced by decreased overall symptoms from 6 to 7 days/week to 0 to 1 days/week per client report for at least 3 consecutive months. Verbally express understanding of the relationship between feelings of depression, anxiety and their impact on thinking patterns and behaviors. Verbalize an understanding of the role that distorted thinking plays in creating fears, excessive worry, and ruminations.    Noralee Chars participated in the creation of the treatment plan)    Delight Ovens, LCSW

## 2023-07-29 ENCOUNTER — Ambulatory Visit (INDEPENDENT_AMBULATORY_CARE_PROVIDER_SITE_OTHER): Payer: BC Managed Care – PPO | Admitting: Psychology

## 2023-07-29 DIAGNOSIS — F418 Other specified anxiety disorders: Secondary | ICD-10-CM | POA: Diagnosis not present

## 2023-07-29 NOTE — Progress Notes (Signed)
[facility] Counselor/Therapist Progress Note  Patient ID: Scott [name], MRN: [id number]   Date: 07/29/23  Time Spent: 2:07 pm - 2:48 pm :  41 Minutes  Treatment Type: Individual Therapy.  Reported Symptoms: anxiety & depression.   Mental Status Exam: Appearance:  Casual     Behavior: Appropriate  Motor: Normal  Speech/Language:  Clear and Coherent  Affect: Flat  Mood: sad  Thought process: normal  Thought content:   WNL  Sensory/Perceptual disturbances:   WNL  Orientation: oriented to person, place, time/date, and situation  Attention: Good  Concentration: Good  Memory: WNL  Fund of knowledge:  Good  Insight:   Good  Judgment:  Good  Impulse Control: Good   Risk Assessment: Danger to Self:  No Self-injurious Behavior: No Danger to Others: No Duty to Warn:no Physical Aggression / Violence:No  Access to Firearms a concern: No  Gang Involvement:No   Subjective:   Scott [name] participated from home, via video, is aware of the limitations of tele-session, and consented to treatment. Therapist participated from home office. Scott [name] discussed the events of the past week. Scott [name] noted working on socializing and noted spending time with a friend who recently moved to [facility]. He noted it being less stressful than he had anticipated. He noted some improvement in his sleep and noted going to sleep and waking earlier than normal. He noted some difficulty with his routine in the AM noted forgetting to eat breakfast,  getting distracted, or feeling groggy.We worked on identifying ways to set reminders including using a whiteboard  &  using a smart speaker to set reminders. He noted feeling anxious while riding in the cars and catastrophizing thinking that he'd get in an accident and noted this occurring on highway driving more often. He endorsed negative self-talk during car rides. He often uses distraction such as listening to music as tools to manage his anxiety.  He  noted this working most of the time. He noted the possible source of this anxiety, which began in his last year of high school. He noted riding with a classmate who drove quite fast without any forewarning, generally not enjoying roller coasts and losing his classes on the ride, and erratic drivers. He denied being in a car accident himself. We worked on identifying possible physical symptoms of his anxiety including "being on edge", tense, and tight muscles. Psycho-education regarding anxiety was provided including education regarding panic symptoms, negative self-talk, automatic negative feelings and thoughts, and the cycle of anxiety. We discussed the importance of relaxation, managing self-talk, identifying evidence. We reviewed 4/7/8 breathing exercise and challenging negative thoughts and feelings. Handouts were provided, via email for reference and review. Scott [name] was engaged and motivated during the session and expressed commitment towards practicing the use of these tools between sessions. Therapist praised Scott [name] and provided supportive therapy. A follow--up was scheduled for continued treatment which he benefits from.   Interventions: CBT and relaxation.   Diagnosis:  Other specified anxiety disorders  Psychiatric Treatment: No , but would benefit from.   Treatment Plan:  Client Abilities/Strengths Scott [name] is self-aware and motivated for change.   Support System: Family  Client Treatment Preferences OPT  Client Statement of Needs Scott [name] would like to manage social anxiety, managing anger, improving sleep, manage symptoms, process events, manage distress, engage in self-care, challenge negative thoughts and feelings, develop a day-to-day rhythm.   Treatment Level Weekly  Symptoms  Anxiety: feeling nervous, difficulty managing worry, worrying about different things, trouble relaxing, irritability, feeling  afraid something awful happen    (Status: maintained) Depression: loss of  interest, difficulty falling asleep and hypersomnia (~9-10), lethargy, hyper-motor agitation. He presented with a flat affect. (Status: maintained)  Goals:   Scott [name] experiences symptoms of depression and anxiety.   Treatment plan signed and available on s-drive:  No, pending signature.    Target Date: 06/05/24 Frequency: Weekly  Progress: 0 Modality: individual    Therapist will provide referrals for additional resources as appropriate.  Therapist will provide psycho-education regarding Scott [name] diagnosis and corresponding treatment approaches and interventions. Licensed Clinical Social Worker, [place], LCSW will support the patient's ability to achieve the goals identified. will employ CBT, BA, Problem-solving, Solution Focused, Mindfulness,  coping skills, & other evidenced-based practices will be used to promote progress towards healthy functioning to help manage decrease symptoms associated with his diagnosis.   Reduce overall level, frequency, and intensity of the feelings of depression, anxiety and panic evidenced by decreased overall symptoms from 6 to 7 days/week to 0 to 1 days/week per client report for at least 3 consecutive months. Verbally express understanding of the relationship between feelings of depression, anxiety and their impact on thinking patterns and behaviors. Verbalize an understanding of the role that distorted thinking plays in creating fears, excessive worry, and ruminations.    Scott [name] participated in the creation of the treatment plan)    Delight Ovens, LCSW

## 2023-08-12 ENCOUNTER — Ambulatory Visit: Payer: BC Managed Care – PPO | Admitting: Psychology

## 2023-08-12 DIAGNOSIS — F418 Other specified anxiety disorders: Secondary | ICD-10-CM

## 2023-08-12 NOTE — Progress Notes (Signed)
[facility] Counselor/Therapist Progress Note  Patient ID: Scott [name], MRN: [id number]   Date: 08/12/23  Time Spent: 2:07 pm - 2:51 pm : 44 Minutes  Treatment Type: Individual Therapy.  Reported Symptoms: anxiety & depression.   Mental Status Exam: Appearance:  Casual     Behavior: Appropriate  Motor: Normal  Speech/Language:  Clear and Coherent  Affect: Flat  Mood: sad  Thought process: normal  Thought content:   WNL  Sensory/Perceptual disturbances:   WNL  Orientation: oriented to person, place, time/date, and situation  Attention: Good  Concentration: Good  Memory: WNL  Fund of knowledge:  Good  Insight:   Good  Judgment:  Good  Impulse Control: Good   Risk Assessment: Danger to Self:  No Self-injurious Behavior: No Danger to Others: No Duty to Warn:no Physical Aggression / Violence:No  Access to Firearms a concern: No  Gang Involvement:No   Subjective:   Scott [name] participated from home, via video, is aware of the limitations of tele-session, and consented to treatment. Therapist participated from home office. Scott [name] discussed the events of the past week. He noted being more active due to helping a friend move. He noted working on employing the reminder system and noted this this being helpful to remember to brush teeth and eat breakfast. He noted reflecting on his parent's divorce to a friend and discussed this was helpful. He noted his friend encouraging him to talk about in therapy. He noted his father being inconsistent with communication and only regularly communicates with Scott [name] on his birthday and Christmas.  He noted his interest to have more consistent communication but noted his father often being temperamental. Despite his interest in having a closer relationship with his father, he has not communicated this directly. He noted his parents divorcing when he was ~ 20 years old. He noted living with his mom after the divorce. We worked on  processing his feelings in relation to not having a close relationship with his father. We worked on exploring ways to communicate needs to his father. We worked on exploring how he would feel if his father had a similar interest or no interest in improving their relationship. He noted that he and his father have a lot in common. Scott [name] noted recently being over stimulated by noise and noted this occurring ~2 times. He noted difficulty when getting blood drawn and noted once getting a concussion as a result of falling. He noted anxiety surrounding this experience. WE will continue pt process this going forward. Therapist praised Scott [name] for his effort during the session and vulnerability.  During the session and provided supportive therapy. A follow-up was scheduled for continued treatment. treatment which he benefits from.   Interventions: CBT and interpersonal  Diagnosis:  Other specified anxiety disorders  Psychiatric Treatment: No , but would benefit from.   Treatment Plan:  Client Abilities/Strengths Scott [name] is self-aware and motivated for change.   Support System: Family  Client Treatment Preferences OPT  Client Statement of Needs Scott [name] would like to manage social anxiety, managing anger, improving sleep, manage symptoms, process events, manage distress, engage in self-care, challenge negative thoughts and feelings, develop a day-to-day rhythm.   Treatment Level Weekly  Symptoms  Anxiety: feeling nervous, difficulty managing worry, worrying about different things, trouble relaxing, irritability, feeling afraid something awful happen    (Status: maintained) Depression: loss of interest, difficulty falling asleep and hypersomnia (~9-10), lethargy, hyper-motor agitation. He presented with a flat affect. (Status: maintained)  Goals:   Scott [name]  experiences symptoms of depression and anxiety.   Treatment plan signed and available on s-drive:  No, pending signature.    Target Date:  06/05/24 Frequency: Weekly  Progress: 0 Modality: individual    Therapist will provide referrals for additional resources as appropriate.  Therapist will provide psycho-education regarding Scott [name]'s diagnosis and corresponding treatment approaches and interventions. Licensed Clinical Social Worker, [place], LCSW will support the patient's ability to achieve the goals identified. will employ CBT, BA, Problem-solving, Solution Focused, Mindfulness,  coping skills, & other evidenced-based practices will be used to promote progress towards healthy functioning to help manage decrease symptoms associated with his diagnosis.   Reduce overall level, frequency, and intensity of the feelings of depression, anxiety and panic evidenced by decreased overall symptoms from 6 to 7 days/week to 0 to 1 days/week per client report for at least 3 consecutive months. Verbally express understanding of the relationship between feelings of depression, anxiety and their impact on thinking patterns and behaviors. Verbalize an understanding of the role that distorted thinking plays in creating fears, excessive worry, and ruminations.    Noralee Chars participated in the creation of the treatment plan)    Delight Ovens, LCSW

## 2023-08-26 ENCOUNTER — Ambulatory Visit (INDEPENDENT_AMBULATORY_CARE_PROVIDER_SITE_OTHER): Payer: BC Managed Care – PPO | Admitting: Psychology

## 2023-08-26 DIAGNOSIS — F418 Other specified anxiety disorders: Secondary | ICD-10-CM

## 2023-08-26 NOTE — Progress Notes (Signed)
[facility] Counselor/Therapist Progress Note  Patient ID: Scott [name], MRN: [id number]   Date: 08/26/23  Time Spent: 2:35 pm - 3:19 pm :  44 Minutes  Treatment Type: Individual Therapy.  Reported Symptoms: anxiety & depression.   Mental Status Exam: Appearance:  Casual     Behavior: Appropriate  Motor: Normal  Speech/Language:  Clear and Coherent  Affect: Flat  Mood: sad  Thought process: normal  Thought content:   WNL  Sensory/Perceptual disturbances:   WNL  Orientation: oriented to person, place, time/date, and situation  Attention: Good  Concentration: Good  Memory: WNL  Fund of knowledge:  Good  Insight:   Good  Judgment:  Good  Impulse Control: Good   Risk Assessment: Danger to Self:  No Self-injurious Behavior: No Danger to Others: No Duty to Warn:no Physical Aggression / Violence:No  Access to Firearms a concern: No  Gang Involvement:No   Subjective:   [name] participated from home, via video, is aware of the limitations of tele-session, and consented to treatment. Therapist participated from home office. Elisabeth Pigeon discussed the events of the past week. Daquon noted struggling with social anxiety and discussed a recent invite to a social gathering by a friend who will invite their own friend, as well. He noted being invited to an activity, hiking, which he has not participated in before. He noted numerous thoughts including will I like this person, what will I talk about, will the experience be ruined, and will I like the activity itself. He noted assuming the worse. Therapist provided psycho-education regarding anxiety, rumination, negative self-talk, and distortions. We discussed the affect of this on our our decision-making (CBT). We discussed ways to identify feelings, challenge assumptions using evidence and date, and reviewed thought challenging handout. Therapist modeled this during the session. Therapist answered Cincere's questions and  encouraged mindfulness, relaxation, and to challenge his negative thoughts and feelings. Therapist provided supportive therapy. A follow-up was scheduled for continued treatment. treatment which he benefits from.   Interventions: CBT and interpersonal  Diagnosis:  Other specified anxiety disorders  Psychiatric Treatment: No , but would benefit from.   Treatment Plan:  Client Abilities/Strengths Yasuo is self-aware and motivated for change.   Support System: Family  Client Treatment Preferences OPT  Client Statement of Needs Filbert would like to manage social anxiety, managing anger, improving sleep, manage symptoms, process events, manage distress, engage in self-care, challenge negative thoughts and feelings, develop a day-to-day rhythm.   Treatment Level Weekly  Symptoms  Anxiety: feeling nervous, difficulty managing worry, worrying about different things, trouble relaxing, irritability, feeling afraid something awful happen    (Status: maintained) Depression: loss of interest, difficulty falling asleep and hypersomnia (~9-10), lethargy, hyper-motor agitation. He presented with a flat affect. (Status: maintained)  Goals:   Ayham experiences symptoms of depression and anxiety.   Treatment plan signed and available on s-drive:  No, pending signature.    Target Date: 06/05/24 Frequency: Weekly  Progress: 0 Modality: individual    Therapist will provide referrals for additional resources as appropriate.  Therapist will provide psycho-education regarding Winfield's diagnosis and corresponding treatment approaches and interventions. Licensed Clinical Social Worker, [place], LCSW will support the patient's ability to achieve the goals identified. will employ CBT, BA, Problem-solving, Solution Focused, Mindfulness,  coping skills, & other evidenced-based practices will be used to promote progress towards healthy functioning to help manage decrease symptoms associated with  his diagnosis.   Reduce overall level, frequency, and intensity of the feelings of depression,  anxiety and panic evidenced by decreased overall symptoms from 6 to 7 days/week to 0 to 1 days/week per client report for at least 3 consecutive months. Verbally express understanding of the relationship between feelings of depression, anxiety and their impact on thinking patterns and behaviors. Verbalize an understanding of the role that distorted thinking plays in creating fears, excessive worry, and ruminations.    Noralee Chars participated in the creation of the treatment plan)    Delight Ovens, LCSW

## 2023-09-09 ENCOUNTER — Ambulatory Visit (INDEPENDENT_AMBULATORY_CARE_PROVIDER_SITE_OTHER): Payer: BC Managed Care – PPO | Admitting: Psychology

## 2023-09-09 DIAGNOSIS — F418 Other specified anxiety disorders: Secondary | ICD-10-CM

## 2023-09-09 NOTE — Progress Notes (Signed)
[facility] Counselor/Therapist Progress Note  Patient ID: Scott [name], MRN: [id number]   Date: 09/09/23  Time Spent: 3:05 pm - 3:50 pm : 45 Minutes  Treatment Type: Individual Therapy.  Reported Symptoms: anxiety & depression.   Mental Status Exam: Appearance:  Casual     Behavior: Appropriate  Motor: Normal  Speech/Language:  Clear and Coherent  Affect: Flat  Mood: dysthymic  Thought process: normal  Thought content:   WNL  Sensory/Perceptual disturbances:   WNL  Orientation: oriented to person, place, time/date, and situation  Attention: Good  Concentration: Good  Memory: WNL  Fund of knowledge:  Good  Insight:   Good  Judgment:  Good  Impulse Control: Good   Risk Assessment: Danger to Self:  No Self-injurious Behavior: No Danger to Others: No Duty to Warn:no Physical Aggression / Violence:No  Access to Firearms a concern: No  Gang Involvement:No   Subjective:   [name] participated from home, via video, is aware of the limitations of tele-session, and consented to treatment. Therapist participated from home office. Elisabeth Pigeon discussed the events of the past week. Joshua noted not having long-term goals including academically and professionally and "worried that If I don't have certain goals, something may go wrong". He noted worries that he wouldn't be able to financially support himself. He is hopeful to be able to help his family financially. He noted the thought of going to school is stressful and noted worry that it would be overwhelming and significant amount of work. We worked on reflecting on his past attempts to identify a possible academic track. We explored this and his anxiety that his social anxiety might be unmanageable due to his social anxiety. He also noted worry about the financial aspects. He noted his interest in getting employment to pay for school and we worked on exploring this. He noted committing to creating a list of  available employers need by and to identify who is hiring. He noted continued anxiety about an upcoming hiking trip but noted a mild reduction of anxiety. Therapist praised Brallan for the suggestion that additional research regarding hiking could help him become more comfortable. We worked on identifying ways to manage stress regarding the scope of the research regarding schooling and academics and we discussed ways to create smaller more manageable tasks. Abdi noted the need to focus on one task at a time. Therapist praised Dak for proactive planning. Therapist validated Tymir's feelings and experience, praised him for his effort, provided supportive therapy, and a follow-up was scheduled for continued treatment.   Interventions: CBT   Diagnosis:  Other specified anxiety disorders  Psychiatric Treatment: No , but would benefit from.   Treatment Plan:  Client Abilities/Strengths Khaliq is self-aware and motivated for change.   Support System: Family  Client Treatment Preferences OPT  Client Statement of Needs Helaman would like to manage social anxiety, managing anger, improving sleep, manage symptoms, process events, manage distress, engage in self-care, challenge negative thoughts and feelings, develop a day-to-day rhythm.   Treatment Level Weekly  Symptoms  Anxiety: feeling nervous, difficulty managing worry, worrying about different things, trouble relaxing, irritability, feeling afraid something awful happen    (Status: maintained) Depression: loss of interest, difficulty falling asleep and hypersomnia (~9-10), lethargy, hyper-motor agitation. He presented with a flat affect. (Status: maintained)  Goals:   Benson experiences symptoms of depression and anxiety.   Treatment plan signed and available on s-drive:  No, pending signature.    Target Date: 06/05/24 Frequency: Weekly  Progress: 0 Modality: individual    Therapist will provide referrals for additional  resources as appropriate.  Therapist will provide psycho-education regarding Jshaun's diagnosis and corresponding treatment approaches and interventions. Licensed Clinical Social Worker, [place], LCSW will support the patient's ability to achieve the goals identified. will employ CBT, BA, Problem-solving, Solution Focused, Mindfulness,  coping skills, & other evidenced-based practices will be used to promote progress towards healthy functioning to help manage decrease symptoms associated with his diagnosis.   Reduce overall level, frequency, and intensity of the feelings of depression, anxiety and panic evidenced by decreased overall symptoms from 6 to 7 days/week to 0 to 1 days/week per client report for at least 3 consecutive months. Verbally express understanding of the relationship between feelings of depression, anxiety and their impact on thinking patterns and behaviors. Verbalize an understanding of the role that distorted thinking plays in creating fears, excessive worry, and ruminations.    Noralee Chars participated in the creation of the treatment plan)    Delight Ovens, LCSW

## 2023-09-23 ENCOUNTER — Ambulatory Visit: Payer: BC Managed Care – PPO | Admitting: Psychology

## 2023-09-23 DIAGNOSIS — F418 Other specified anxiety disorders: Secondary | ICD-10-CM | POA: Diagnosis not present

## 2023-09-23 NOTE — Progress Notes (Signed)
[facility] Counselor/Therapist Progress Note  Patient ID: [name], MRN: [id number]   Date: 09/23/23  Time Spent: 3:04 pm - 3:51 pm : 47 Minutes  Treatment Type: Individual Therapy.  Reported Symptoms: anxiety & depression.   Mental Status Exam: Appearance:  Casual     Behavior: Appropriate  Motor: Normal  Speech/Language:  Clear and Coherent  Affect: Flat  Mood: dysthymic  Thought process: normal  Thought content:   WNL  Sensory/Perceptual disturbances:   WNL  Orientation: oriented to person, place, time/date, and situation  Attention: Good  Concentration: Good  Memory: WNL  Fund of knowledge:  Good  Insight:   Good  Judgment:  Good  Impulse Control: Good   Risk Assessment: Danger to Self:  No Self-injurious Behavior: No Danger to Others: No Duty to Warn:no Physical Aggression / Violence:No  Access to Firearms a concern: No  Gang Involvement:No   Subjective:   Scott [name] participated from home, via video, is aware of the limitations of tele-session, and consented to treatment. Therapist participated from home office. Scott [name] discussed the events of the past week. Scott [name] noted recently celebrating his birthday and noted it being enjoyable. He noted family reaching out during this time, with some exceptions. He noted some familial stress, in the extended family, that was been stressful and wondering "how is that going to go" and endorsed anxiety. He noted a history of family members not being aware of the stress they can impose on others. He noted a history of poor boundaries from family. He noted his friend canceling plans that have been in place for some time. We worked on feeling identification regarding this issue and Scott [name] noted feeling disappointment and then later annoyed by his friends behavior. We worked on exploring this during the session. He noted feeling ill and considering changing his plans as a result. He noted following up on a  previous goal of researching possible employment opportunities. Therapist praised Scott [name] for his effort towards the goal and therapist encouraged continued effort in this area. He noted working on finding a positive that will provide him with his needs. HE noted feeing productive and noted feeling "like I am actually doing something". He is also keeping up with his morning routine and noted this paying dividens. Therapist encouraged continued effort in this area. Scott [name] was receptive during the session and expressed commitment towards goals. He noted his work to minimizing outputting frustration towards others. He provided feedback about his relationship with his  father and noted that his father thinks that [place] hates him, much to Scott [name]'s surprise. Alter noted the lack of frequency of contact from his father. We will continue to explore this going forward. Therapist praised Scott [name] for his effort between sessions and provided supportive therapy. A follow-up was scheduled for continued treatment.    Interventions: CBT  & interpersonal.   Diagnosis:  Other specified anxiety disorders  Psychiatric Treatment: No , but would benefit from.   Treatment Plan:  Client Abilities/Strengths Scott [name] is self-aware and motivated for change.   Support System: Family  Client Treatment Preferences OPT  Client Statement of Needs Scott [name] would like to manage social anxiety, managing anger, improving sleep, manage symptoms, process events, manage distress, engage in self-care, challenge negative thoughts and feelings, develop a day-to-day rhythm.   Treatment Level Weekly  Symptoms  Anxiety: feeling nervous, difficulty managing worry, worrying about different things, trouble relaxing, irritability, feeling afraid something awful happen    (Status: maintained) Depression: loss of interest, difficulty falling  asleep and hypersomnia (~9-10), lethargy, hyper-motor agitation. He presented with a flat affect.  (Status: maintained)  Goals:   Scott [name] experiences symptoms of depression and anxiety.   Treatment plan signed and available on s-drive:  No, pending signature.    Target Date: 06/05/24 Frequency: Weekly  Progress: 0 Modality: individual    Therapist will provide referrals for additional resources as appropriate.  Therapist will provide psycho-education regarding Scott [name]'s diagnosis and corresponding treatment approaches and interventions. Licensed Clinical Social Worker, [place], LCSW will support the patient's ability to achieve the goals identified. will employ CBT, BA, Problem-solving, Solution Focused, Mindfulness,  coping skills, & other evidenced-based practices will be used to promote progress towards healthy functioning to help manage decrease symptoms associated with his diagnosis.   Reduce overall level, frequency, and intensity of the feelings of depression, anxiety and panic evidenced by decreased overall symptoms from 6 to 7 days/week to 0 to 1 days/week per client report for at least 3 consecutive months. Verbally express understanding of the relationship between feelings of depression, anxiety and their impact on thinking patterns and behaviors. Verbalize an understanding of the role that distorted thinking plays in creating fears, excessive worry, and ruminations.    Scott [name] participated in the creation of the treatment plan)    Delight Ovens, LCSW

## 2023-10-07 ENCOUNTER — Ambulatory Visit (INDEPENDENT_AMBULATORY_CARE_PROVIDER_SITE_OTHER): Payer: BC Managed Care – PPO | Admitting: Psychology

## 2023-10-07 DIAGNOSIS — F418 Other specified anxiety disorders: Secondary | ICD-10-CM

## 2023-10-07 NOTE — Progress Notes (Signed)
[facility] Counselor/Therapist Progress Note  Patient ID: Scott [name], MRN: [id number]   Date: 10/07/23  Time Spent: 11:02 am - 11:45 am : 43 Minutes  Treatment Type: Individual Therapy.  Reported Symptoms: anxiety & depression.   Mental Status Exam: Appearance:  Casual     Behavior: Appropriate  Motor: Normal  Speech/Language:  Clear and Coherent  Affect: Flat  Mood: dysthymic  Thought process: normal  Thought content:   WNL  Sensory/Perceptual disturbances:   WNL  Orientation: oriented to person, place, time/date, and situation  Attention: Good  Concentration: Good  Memory: WNL  Fund of knowledge:  Good  Insight:   Good  Judgment:  Good  Impulse Control: Good   Risk Assessment: Danger to Self:  No Self-injurious Behavior: No Danger to Others: No Duty to Warn:no Physical Aggression / Violence:No  Access to Firearms a concern: No  Gang Involvement:No   Subjective:   Scott [name] participated from home, via video, is aware of the limitations of tele-session, and consented to treatment. Therapist participated from home office. Scott [name] discussed the events of the past week. He noted feeling sick and discussed worry that he would get worse and that his his rising anxiety might cause a panic attack. He noted a history of health related anxiety. He noted previously popping a blood blister around ~3 years ago. He noted hyperventilating after the fact and eventually blacked out. He noted his mother catching him. He discussed blacking out, a second time which resulted in a concussion. He noted often experiencing breathing changes when he is anxious about his health or feels like he is going to black out. Additional symptoms include ~ elevated heart rate and muscle tension. He noted his self-talk including "something could be happening to me that is worse than it really is". He noted anxiety during blood draw since the blood blister incident. July noted his  coping during this time including distraction and listening to music. Psycho-education regarding regarding panic was provided including the ingredients of panic. Additionally, we discussed breathing exercises, isometric relaxation, and grounding exercises. Therapist modeled this during the session and provided resources via email for reference and review. We worked on identifying upcoming positive events. Scott [name] was engaged and motivated during the session. He expressed commitment towards our goals and committed to employing the tools discussed during the session. Therapist praised Scott [name] for his effort during the session and expressed commitment towards goals. A follow-up was scheduled for continued treatment.   Interventions: CBT  & relaxation  Diagnosis:  Other specified anxiety disorders  Psychiatric Treatment: No , but would benefit from.   Treatment Plan:  Client Abilities/Strengths Scott [name] is self-aware and motivated for change.   Support System: Family  Client Treatment Preferences OPT  Client Statement of Needs Scott [name] would like to manage social anxiety, managing anger, improving sleep, manage symptoms, process events, manage distress, engage in self-care, challenge negative thoughts and feelings, develop a day-to-day rhythm.   Treatment Level Weekly  Symptoms  Anxiety: feeling nervous, difficulty managing worry, worrying about different things, trouble relaxing, irritability, feeling afraid something awful happen    (Status: maintained) Depression: loss of interest, difficulty falling asleep and hypersomnia (~9-10), lethargy, hyper-motor agitation. He presented with a flat affect. (Status: maintained)  Goals:   Scott [name] experiences symptoms of depression and anxiety.   Treatment plan signed and available on s-drive:  No, pending signature.    Target Date: 06/05/24 Frequency: Weekly  Progress: 0 Modality: individual    Therapist will provide referrals  for additional  resources as appropriate.  Therapist will provide psycho-education regarding Scott [name]'s diagnosis and corresponding treatment approaches and interventions. Licensed Clinical Social Worker, [place], LCSW will support the patient's ability to achieve the goals identified. will employ CBT, BA, Problem-solving, Solution Focused, Mindfulness,  coping skills, & other evidenced-based practices will be used to promote progress towards healthy functioning to help manage decrease symptoms associated with his diagnosis.   Reduce overall level, frequency, and intensity of the feelings of depression, anxiety and panic evidenced by decreased overall symptoms from 6 to 7 days/week to 0 to 1 days/week per client report for at least 3 consecutive months. Verbally express understanding of the relationship between feelings of depression, anxiety and their impact on thinking patterns and behaviors. Verbalize an understanding of the role that distorted thinking plays in creating fears, excessive worry, and ruminations.    Scott [name] participated in the creation of the treatment plan)   Delight Ovens, LCSW

## 2023-10-20 ENCOUNTER — Other Ambulatory Visit: Payer: Self-pay | Admitting: Medical

## 2023-10-21 ENCOUNTER — Ambulatory Visit (INDEPENDENT_AMBULATORY_CARE_PROVIDER_SITE_OTHER): Payer: BC Managed Care – PPO | Admitting: Psychology

## 2023-10-21 DIAGNOSIS — F418 Other specified anxiety disorders: Secondary | ICD-10-CM

## 2023-10-21 NOTE — Progress Notes (Signed)
[facility] Counselor/Therapist Progress Note  Patient ID: Scott [name], MRN: [id number]   Date: 10/21/23  Time Spent: 12:05 pm - 12:48 pm :43 Minutes  Treatment Type: Individual Therapy.  Reported Symptoms: anxiety & depression.   Mental Status Exam: Appearance:  Casual     Behavior: Appropriate  Motor: Normal  Speech/Language:  Clear and Coherent  Affect: Flat  Mood: dysthymic  Thought process: normal  Thought content:   WNL  Sensory/Perceptual disturbances:   WNL  Orientation: oriented to person, place, time/date, and situation  Attention: Good  Concentration: Good  Memory: WNL  Fund of knowledge:  Good  Insight:   Good  Judgment:  Good  Impulse Control: Good   Risk Assessment: Danger to Self:  No Self-injurious Behavior: No Danger to Others: No Duty to Warn:no Physical Aggression / Violence:No  Access to Firearms a concern: No  Gang Involvement:No   Subjective:   Scott [name] participated from home, via video, is aware of the limitations of tele-session, and consented to treatment. Therapist participated from home office. Scott [name] discussed the events of the past week.  Scott [name] noted feeling bad due to heartburn, the election results, not sleeping well, and his mother likely needing knee surgery. He noted worry about how certain communities, his family, and the country will have to deal with. Specifically the economy and LGBT community (brother who is married to husband), and how these areas will be affected. He noted working on distracting self, staying away from social media, listening to music, reach out to friends, play video games, and think positively. He noted often feeling "a small pit in my stomach". We discussed his self-talk and worked on identifying ways to manage this. Therapist encouraged Scott [name] to identify areas of control and lack of control. Therapist provided psycho-education regarding anxiety and worry and introduced worry time.  Therapist modeled this during the session.  [name] was engaged and motivated during the session. He expressed commitment toward the session goals. Therapist praised Scott [name] and provided supportive therapy. A follow-up was scheduled for continued treatment.    Interventions: CBT  & relaxation  Diagnosis:  Other specified anxiety disorders  Psychiatric Treatment: No , but would benefit from.   Treatment Plan:  Client Abilities/Strengths My is self-aware and motivated for change.   Support System: Family  Client Treatment Preferences OPT  Client Statement of Needs Scott [name] would like to manage social anxiety, managing anger, improving sleep, manage symptoms, process events, manage distress, engage in self-care, challenge negative thoughts and feelings, develop a day-to-day rhythm.   Treatment Level Weekly  Symptoms  Anxiety: feeling nervous, difficulty managing worry, worrying about different things, trouble relaxing, irritability, feeling afraid something awful happen    (Status: maintained) Depression: loss of interest, difficulty falling asleep and hypersomnia (~9-10), lethargy, hyper-motor agitation. He presented with a flat affect. (Status: maintained)  Goals:   Scott [name] experiences symptoms of depression and anxiety.   Treatment plan signed and available on s-drive:  No, pending signature.    Target Date: 06/05/24 Frequency: Weekly  Progress: 0 Modality: individual    Therapist will provide referrals for additional resources as appropriate.  Therapist will provide psycho-education regarding Scott [name]'s diagnosis and corresponding treatment approaches and interventions. Licensed Clinical Social Worker, [place], LCSW will support the patient's ability to achieve the goals identified. will employ CBT, BA, Problem-solving, Solution Focused, Mindfulness,  coping skills, & other evidenced-based practices will be used to promote progress towards healthy functioning to help  manage decrease symptoms associated with his diagnosis.  Reduce overall level, frequency, and intensity of the feelings of depression, anxiety and panic evidenced by decreased overall symptoms from 6 to 7 days/week to 0 to 1 days/week per client report for at least 3 consecutive months. Verbally express understanding of the relationship between feelings of depression, anxiety and their impact on thinking patterns and behaviors. Verbalize an understanding of the role that distorted thinking plays in creating fears, excessive worry, and ruminations.    Noralee Chars participated in the creation of the treatment plan)   Delight Ovens, LCSW

## 2023-11-04 ENCOUNTER — Ambulatory Visit (INDEPENDENT_AMBULATORY_CARE_PROVIDER_SITE_OTHER): Payer: BC Managed Care – PPO | Admitting: Psychology

## 2023-11-04 DIAGNOSIS — F418 Other specified anxiety disorders: Secondary | ICD-10-CM | POA: Diagnosis not present

## 2023-11-04 NOTE — Progress Notes (Signed)
[facility] Counselor/Therapist Progress Note  Patient ID: Scott [name], MRN: [id number]   Date: 11/04/23  Time Spent: 3:02 pm - 3:37 pm 35 Minutes  Treatment Type: Individual Therapy.  Reported Symptoms: anxiety & depression.   Mental Status Exam: Appearance:  Casual     Behavior: Appropriate  Motor: Normal  Speech/Language:  Clear and Coherent  Affect: Flat  Mood: dysthymic  Thought process: normal  Thought content:   WNL  Sensory/Perceptual disturbances:   WNL  Orientation: oriented to person, place, time/date, and situation  Attention: Good  Concentration: Good  Memory: WNL  Fund of knowledge:  Good  Insight:   Good  Judgment:  Good  Impulse Control: Good   Risk Assessment: Danger to Self:  No Self-injurious Behavior: No Danger to Others: No Duty to Warn:no Physical Aggression / Violence:No  Access to Firearms a concern: No  Gang Involvement:No   Subjective:   [name] participated from home, via video, is aware of the limitations of tele-session, and consented to treatment. Therapist participated from home office. Elisabeth Pigeon discussed the events of the past week.  Javarri noted continued stress between his grandmother and mother and this possibly affecting holiday plans. He noted "annoyed" by his grandmother's behavior and his grandmother missing his birthday. He wondered if this was purposeful. He noted that attending the thanksgiving celebration would require a "fake smile". He noted difficulty identifying what he will do. We worked on processing this during the session. We worked on feeling identification during the session. He noted his anxiety regarding politics being more manageable although it continues to be stressful. Therapist validated Lamere's feelings and experience during the session. He noted his anxiety about Christmas given the state of the family dynamics. He noted looking forward to the possibility of snow. He noted continuing to  maintain a healthy diet but noted that his intended sleep schedule not  being consistent. He noted his interest in working on managing his anger and working on not having his anger affect interactions throughout the day. Therapist encouraged Majesty to work on journaling regarding his anger, how he manages it, and how it can affect him throughout the day through various interactions. Iroh was engaged and motivated during the session. He expressed commitment towards goals. Therapist praised Blandon and provided supportive therapy. A follow-up was scheduled for continued treatment.    Interventions: CBT  & relaxation  Diagnosis:  Other specified anxiety disorders  Psychiatric Treatment: No , but would benefit from.   Treatment Plan:  Client Abilities/Strengths Seneca is self-aware and motivated for change.   Support System: Family  Client Treatment Preferences OPT  Client Statement of Needs Demari would like to manage social anxiety, managing anger, improving sleep, manage symptoms, process events, manage distress, engage in self-care, challenge negative thoughts and feelings, develop a day-to-day rhythm.   Treatment Level Weekly  Symptoms  Anxiety: feeling nervous, difficulty managing worry, worrying about different things, trouble relaxing, irritability, feeling afraid something awful happen    (Status: maintained) Depression: loss of interest, difficulty falling asleep and hypersomnia (~9-10), lethargy, hyper-motor agitation. He presented with a flat affect. (Status: maintained)  Goals:   Braddock experiences symptoms of depression and anxiety.   Treatment plan signed and available on s-drive:  No, pending signature.    Target Date: 06/05/24 Frequency: Weekly  Progress: 0 Modality: individual    Therapist will provide referrals for additional resources as appropriate.  Therapist will provide psycho-education regarding Hobert's diagnosis and corresponding treatment approaches  and interventions. Licensed  Clinical Social Worker, [place], LCSW will support the patient's ability to achieve the goals identified. will employ CBT, BA, Problem-solving, Solution Focused, Mindfulness,  coping skills, & other evidenced-based practices will be used to promote progress towards healthy functioning to help manage decrease symptoms associated with his diagnosis.   Reduce overall level, frequency, and intensity of the feelings of depression, anxiety and panic evidenced by decreased overall symptoms from 6 to 7 days/week to 0 to 1 days/week per client report for at least 3 consecutive months. Verbally express understanding of the relationship between feelings of depression, anxiety and their impact on thinking patterns and behaviors. Verbalize an understanding of the role that distorted thinking plays in creating fears, excessive worry, and ruminations.    Noralee Chars participated in the creation of the treatment plan)   Delight Ovens, LCSW

## 2023-11-16 ENCOUNTER — Ambulatory Visit: Payer: BC Managed Care – PPO | Admitting: Psychology

## 2023-11-24 ENCOUNTER — Ambulatory Visit: Payer: BC Managed Care – PPO | Admitting: Psychology

## 2023-11-24 DIAGNOSIS — F418 Other specified anxiety disorders: Secondary | ICD-10-CM | POA: Diagnosis not present

## 2023-11-24 NOTE — Progress Notes (Signed)
[facility] Counselor/Therapist Progress Note  Patient ID: Scott [name], MRN: [id number]   Date: 11/24/23  Time Spent: 1:05 pm - 1:44 pm:  39 Minutes  Treatment Type: Individual Therapy.  Reported Symptoms: anxiety & depression.   Mental Status Exam: Appearance:  Casual     Behavior: Appropriate  Motor: Normal  Speech/Language:  Clear and Coherent  Affect: Flat  Mood: dysthymic  Thought process: normal  Thought content:   WNL  Sensory/Perceptual disturbances:   WNL  Orientation: oriented to person, place, time/date, and situation  Attention: Good  Concentration: Good  Memory: WNL  Fund of knowledge:  Good  Insight:   Good  Judgment:  Good  Impulse Control: Good   Risk Assessment: Danger to Self:  No Self-injurious Behavior: No Danger to Others: No Duty to Warn:no Physical Aggression / Violence:No  Access to Firearms a concern: No  Gang Involvement:No   Subjective:   Scott [name] participated from home, via video, is aware of the limitations of tele-session, and consented to treatment. Therapist participated from home office. Scott [name] discussed the events of the past week.  He noted that Thanksgiving was less stressful than anticipate. He noted his efforts to continue working on his sleep and eating. He is working on socializing more often outside of the home. He noted recently breaking his TV remote due frustration while playing a video-game. We worked on exploring this during the session. He noted a need to get "stress ball" to aid coping with his anger. Therapist provided Scott [name] with handouts, via email, for feeling identification, identifying triggers, and psycho-education regarding the cycle of anger. HE noted that his anger is typically in response to playing video games  in relation to how he is performing. He noted often cursing, yelling, or walking away when frustration. Scott [name] will work on documenting various reactions to his stress. We worked on  identifying upcoming enjoyable events or activities. Therapist praised Scott [name] for his effort during these session. Therapist provided psycho-education regarding anger during the session. Therapist validated and normalized Scott [name]'s feelings and experience and provided supportive therapy.    Interventions: CBT  & relaxation  Diagnosis:  No diagnosis found.  Psychiatric Treatment: No , but would benefit from.   Treatment Plan:  Client Abilities/Strengths Scott [name] is self-aware and motivated for change.   Support System: Family  Client Treatment Preferences OPT  Client Statement of Needs Scott [name] would like to manage social anxiety, managing anger, improving sleep, manage symptoms, process events, manage distress, engage in self-care, challenge negative thoughts and feelings, develop a day-to-day rhythm.   Treatment Level Weekly  Symptoms  Anxiety: feeling nervous, difficulty managing worry, worrying about different things, trouble relaxing, irritability, feeling afraid something awful happen    (Status: maintained) Depression: loss of interest, difficulty falling asleep and hypersomnia (~9-10), lethargy, hyper-motor agitation. He presented with a flat affect. (Status: maintained)  Goals:   Scott [name] experiences symptoms of depression and anxiety.   Treatment plan signed and available on s-drive:  No, pending signature.    Target Date: 06/05/24 Frequency: Weekly  Progress: 0 Modality: individual    Therapist will provide referrals for additional resources as appropriate.  Therapist will provide psycho-education regarding [name]'s diagnosis and corresponding treatment approaches and interventions. Licensed Clinical Social Worker, [place], LCSW will support the patient's ability to achieve the goals identified. will employ CBT, BA, Problem-solving, Solution Focused, Mindfulness,  coping skills, & other evidenced-based practices will be used to promote progress towards healthy  functioning to help manage decrease symptoms  associated with his diagnosis.   Reduce overall level, frequency, and intensity of the feelings of depression, anxiety and panic evidenced by decreased overall symptoms from 6 to 7 days/week to 0 to 1 days/week per client report for at least 3 consecutive months. Verbally express understanding of the relationship between feelings of depression, anxiety and their impact on thinking patterns and behaviors. Verbalize an understanding of the role that distorted thinking plays in creating fears, excessive worry, and ruminations.    Scott [name] participated in the creation of the treatment plan)   Delight Ovens, LCSW

## 2023-12-20 ENCOUNTER — Other Ambulatory Visit: Payer: Self-pay | Admitting: Medical

## 2023-12-22 ENCOUNTER — Ambulatory Visit: Payer: BC Managed Care – PPO | Admitting: Psychology

## 2024-01-05 ENCOUNTER — Ambulatory Visit: Payer: BC Managed Care – PPO | Admitting: Psychology

## 2024-01-05 DIAGNOSIS — F418 Other specified anxiety disorders: Secondary | ICD-10-CM

## 2024-01-05 NOTE — Progress Notes (Signed)
[facility] Counselor/Therapist Progress Note  Patient ID: Giorgi Winsted, MRN: [id number]   Date: 01/05/24  Time Spent: 3:05 pm - 3:52 pm:  47 Minutes  Treatment Type: Individual Therapy.  Reported Symptoms: anxiety & depression.   Mental Status Exam: Appearance:  Casual     Behavior: Appropriate  Motor: Normal  Speech/Language:  Clear and Coherent  Affect: Flat  Mood: dysthymic  Thought process: normal  Thought content:   WNL  Sensory/Perceptual disturbances:   WNL  Orientation: oriented to person, place, time/date, and situation  Attention: Good  Concentration: Good  Memory: WNL  Fund of knowledge:  Good  Insight:   Good  Judgment:  Good  Impulse Control: Good   Risk Assessment: Danger to Self:  No Self-injurious Behavior: No Danger to Others: No Duty to Warn:no Physical Aggression / Violence:No  Access to Firearms a concern: No  Gang Involvement:No   Subjective:   [name] participated from home, via video, is aware of the limitations of tele-session, and consented to treatment. Therapist participated from home office. Elisabeth Pigeon discussed the events of the past week. Amire noted "being annoyed at small things". He noted getting upset and breaking a second pair of headphones. He noted getting angry, while playing video-games,  and noted throwing it across the room. He noted his rising frustration when "something doesn't go right or when I am trying to figure something". He noted reading some of the material from the past session but noted not reading it all because he is distracted. He noted that he can get angry and that it can "happen without realizing it". When he is aware of his frustration, he attempts to take a break and listen to music or play a different game. He noted feeling like "a ball of anger that eventually explodes". He noted that when he gets "extremely angry that I end up crying". We worked on exploring his anger and Miranda noted  feelings of sadness of failure. Self-talk included "I should be better than I am". He noted feeling like a "failure" for "losing games or having to look-up the answer". We worked on exploring his expectations for self and what he expects others expect of him noting "I expect people to think I am good at videos". Therapist encouraged Cid to identify expectations for self and others, to work on setting reasonable expectations, and to work on identifying areas to invest in, in addition to video-games, to have balance day to day. We worked on reviewing his coping skills during the session and distress tolerance. Issachar was engaged and motivated during the session. He expressed commitment during the session. Therapist praised Yoskar for his effort during the session. A follow-up was scheduled for continued treatment, which he benefits from.   Interventions: CBT  & relaxation  Diagnosis:  Other specified anxiety disorders  Psychiatric Treatment: No , but would benefit from.   Treatment Plan:  Client Abilities/Strengths Calistro is self-aware and motivated for change.   Support System: Family  Client Treatment Preferences OPT  Client Statement of Needs Crue would like to manage social anxiety, managing anger, improving sleep, manage symptoms, process events, manage distress, engage in self-care, challenge negative thoughts and feelings, develop a day-to-day rhythm.   Treatment Level Weekly  Symptoms  Anxiety: feeling nervous, difficulty managing worry, worrying about different things, trouble relaxing, irritability, feeling afraid something awful happen    (Status: maintained) Depression: loss of interest, difficulty falling asleep and hypersomnia (~9-10), lethargy, hyper-motor agitation. He presented with a  flat affect. (Status: maintained)  Goals:   Haik experiences symptoms of depression and anxiety.   Treatment plan signed and available on s-drive:  No, pending signature.     Target Date: 06/05/24 Frequency: Weekly  Progress: 0 Modality: individual    Therapist will provide referrals for additional resources as appropriate.  Therapist will provide psycho-education regarding Ariyan's diagnosis and corresponding treatment approaches and interventions. Licensed Clinical Social Worker, [place], LCSW will support the patient's ability to achieve the goals identified. will employ CBT, BA, Problem-solving, Solution Focused, Mindfulness,  coping skills, & other evidenced-based practices will be used to promote progress towards healthy functioning to help manage decrease symptoms associated with his diagnosis.   Reduce overall level, frequency, and intensity of the feelings of depression, anxiety and panic evidenced by decreased overall symptoms from 6 to 7 days/week to 0 to 1 days/week per client report for at least 3 consecutive months. Verbally express understanding of the relationship between feelings of depression, anxiety and their impact on thinking patterns and behaviors. Verbalize an understanding of the role that distorted thinking plays in creating fears, excessive worry, and ruminations.    Noralee Chars participated in the creation of the treatment plan)   Delight Ovens, LCSW

## 2024-01-25 ENCOUNTER — Ambulatory Visit: Payer: BC Managed Care – PPO | Admitting: Psychology

## 2024-01-25 DIAGNOSIS — F418 Other specified anxiety disorders: Secondary | ICD-10-CM | POA: Diagnosis not present

## 2024-01-25 NOTE — Progress Notes (Signed)
[facility] Counselor/Therapist Progress Note  Patient ID: Scott [name], MRN: [id number]   Date: 01/25/24  Time Spent: 3:07 pm - 3:45 pm: 38 Minutes  Treatment Type: Individual Therapy.  Reported Symptoms: anxiety & depression.   Mental Status Exam: Appearance:  Casual     Behavior: Appropriate  Motor: Normal  Speech/Language:  Clear and Coherent  Affect: Flat  Mood: dysthymic  Thought process: normal  Thought content:   WNL  Sensory/Perceptual disturbances:   WNL  Orientation: oriented to person, place, time/date, and situation  Attention: Good  Concentration: Good  Memory: WNL  Fund of knowledge:  Good  Insight:   Good  Judgment:  Good  Impulse Control: Good   Risk Assessment: Danger to Self:  No Self-injurious Behavior: No Danger to Others: No Duty to Warn:no Physical Aggression / Violence:No  Access to Firearms a concern: No  Gang Involvement:No   Subjective:   [name] participated from home, via video, is aware of the limitations of tele-session, and consented to treatment. Therapist participated from home office. Elisabeth Pigeon discussed the events of the past week. Aadin noted recently going on a hike with his friend and noted feeling woozy during it. We worked on reflecting on his preparation for this hike including eating and hydration. He noted working on taking more breaks to aid in managing his frustration and anger, if it builds. Therapist praised Avish for his effort in this area and encouraged continued efforts. 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 Additionally, therapist encouraged Renley to work on feeling identification, at times of frustration, once he is feeling less activated. We worked on identifying additional activities that he could engage in to create balance in his day-to-day. Shadi noted interest in various crafting projects.  He noted interest in learning the guitar, drums, and saxophone. He noted a need for more autonomy and noted his interest in getting a bike and learning how to drive a car. We worked on identifying possible resources for this and Urijah will work on this between sessions. Therapist praised Morgan for his effort to manage his symptoms and his effort during the session. Therapist provided supportive therapy and a follow-up was scheduled for continued treatment.    Interventions: CBT  & relaxation  Diagnosis:  Other specified anxiety disorders  Psychiatric Treatment: No , but would benefit from.   Treatment Plan:  Client Abilities/Strengths Galvin is self-aware and motivated for change.   Support System: Family  Client Treatment Preferences OPT  Client Statement of Needs Deontrae would like to manage social anxiety, managing anger, improving sleep, manage symptoms, process events, manage distress, engage in self-care, challenge negative thoughts and feelings, develop a day-to-day rhythm.   Treatment Level Weekly  Symptoms  Anxiety: feeling nervous, difficulty managing worry, worrying about different things, trouble relaxing, irritability, feeling afraid something awful happen    (Status: maintained) Depression: loss of interest, difficulty falling asleep and hypersomnia (~9-10), lethargy, hyper-motor agitation. He presented with a flat affect. (Status: maintained)  Goals:   Cohan experiences symptoms of depression and anxiety.   Treatment plan signed and available on s-drive:  No, pending signature.    Target Date: 06/05/24 Frequency: Weekly  Progress: 0 Modality: individual    Therapist will provide referrals for additional resources as appropriate.  Therapist will provide psycho-education regarding Aristotelis's diagnosis and corresponding treatment approaches and interventions. Licensed Clinical Social Worker, [place], LCSW will support the  patient's ability to achieve the  goals identified. will employ CBT, BA, Problem-solving, Solution Focused, Mindfulness,  coping skills, & other evidenced-based practices will be used to promote progress towards healthy functioning to help manage decrease symptoms associated with his diagnosis.   Reduce overall level, frequency, and intensity of the feelings of depression, anxiety and panic evidenced by decreased overall symptoms from 6 to 7 days/week to 0 to 1 days/week per client report for at least 3 consecutive months. Verbally express understanding of the relationship between feelings of depression, anxiety and their impact on thinking patterns and behaviors. Verbalize an understanding of the role that distorted thinking plays in creating fears, excessive worry, and ruminations.    Noralee Chars participated in the creation of the treatment plan)   Delight Ovens, LCSW

## 2024-02-08 ENCOUNTER — Ambulatory Visit: Payer: BC Managed Care – PPO | Admitting: Psychology

## 2024-02-08 DIAGNOSIS — F418 Other specified anxiety disorders: Secondary | ICD-10-CM

## 2024-02-08 NOTE — Progress Notes (Signed)
 [facility] Counselor/Therapist Progress Note  Patient ID: Scott [name], MRN: [id number]   Date: 02/08/24  Time Spent: 3:03 pm - 3:43 pm: 40 Minutes  Treatment Type: Individual Therapy.  Reported Symptoms: anxiety & depression.   Mental Status Exam: Appearance:  Casual     Behavior: Appropriate  Motor: Normal  Speech/Language:  Clear and Coherent  Affect: Flat  Mood: dysthymic  Thought process: normal  Thought content:   WNL  Sensory/Perceptual disturbances:   WNL  Orientation: oriented to person, place, time/date, and situation  Attention: Good  Concentration: Good  Memory: WNL  Fund of knowledge:  Good  Insight:   Good  Judgment:  Good  Impulse Control: Good   Risk Assessment: Danger to Self:  No Self-injurious Behavior: No Danger to Others: No Duty to Warn:no Physical Aggression / Violence:No  Access to Firearms a concern: No  Gang Involvement:No   Subjective:   Scott [name] participated from home, via video, is aware of the limitations of tele-session, and consented to treatment. Therapist participated from home office. Scott [name] discussed the events of the past week. He noted feeling tired and noted sleeping around ~10 hours. He noted feeling anxiety recently but being unsure why he felt this. He noted having a "weird feeling in my stomach" but noted that this feeling dissipated as he went to sleep. He noted his attempt to identify a route cause of this to no avail. He noted the possibility that it was because he was sleeping at his grandmother's home, which is a "new area". He noted his anxiety about the results of a biopsy taken by the dermatologist. He noted worry regarding this being a chronic issue. We worked on exploring this during the session. We worked on exploring his coping skills including socializing, distraction of playing games or spending time with dog, listening to music and relaxing. He noted previously journaling about his anxiety. We  worked on identifying possible coping skills including talking to others, getting input about their perspective, a short walk, breathing techniques. Relaxation. We discussed the importance checking in with self specifically thoughts, feelings, and physical sensations. Therapist provided psycho-education regarding anxiety including reactive and proactive symptom management. Scott [name] was open to working on employing these tools going forward. He noted an upcoming game he is excited about. We worked one exploring this in relation to his frustration playing video games. He noted his intent take breaks and set a time. Therapist praised Scott [name] for his effort in the session. Therapist validated Scott [name]'s feelings and experience and provided supportive therapy.     Interventions: CBT  & relaxation  Diagnosis:  Other specified anxiety disorders  Psychiatric Treatment: No , but would benefit from.   Treatment Plan:  Client Abilities/Strengths Scott [name] is self-aware and motivated for change.   Support System: Family  Client Treatment Preferences OPT  Client Statement of Needs Scott [name] would like to manage social anxiety, managing anger, improving sleep, manage symptoms, process events, manage distress, engage in self-care, challenge negative thoughts and feelings, develop a day-to-day rhythm.   Treatment Level Weekly  Symptoms  Anxiety: feeling nervous, difficulty managing worry, worrying about different things, trouble relaxing, irritability, feeling afraid something awful happen    (Status: maintained) Depression: loss of interest, difficulty falling asleep and hypersomnia (~9-10), lethargy, hyper-motor agitation. He presented with a flat affect. (Status: maintained)  Goals:   Scott [name] experiences symptoms of depression and anxiety.   Treatment plan signed and available on s-drive:  No, pending signature.    Target Date:  06/05/24 Frequency: Weekly  Progress: 0 Modality: individual     Therapist will provide referrals for additional resources as appropriate.  Therapist will provide psycho-education regarding Scott [name]'s diagnosis and corresponding treatment approaches and interventions. Licensed Clinical Social Worker, [place], LCSW will support the patient's ability to achieve the goals identified. will employ CBT, BA, Problem-solving, Solution Focused, Mindfulness,  coping skills, & other evidenced-based practices will be used to promote progress towards healthy functioning to help manage decrease symptoms associated with his diagnosis.   Reduce overall level, frequency, and intensity of the feelings of depression, anxiety and panic evidenced by decreased overall symptoms from 6 to 7 days/week to 0 to 1 days/week per client report for at least 3 consecutive months. Verbally express understanding of the relationship between feelings of depression, anxiety and their impact on thinking patterns and behaviors. Verbalize an understanding of the role that distorted thinking plays in creating fears, excessive worry, and ruminations.    Scott [name] participated in the creation of the treatment plan)   Scott Ovens, LCSW

## 2024-02-22 ENCOUNTER — Ambulatory Visit: Payer: BC Managed Care – PPO | Admitting: Psychology

## 2024-02-22 DIAGNOSIS — F418 Other specified anxiety disorders: Secondary | ICD-10-CM | POA: Diagnosis not present

## 2024-02-22 NOTE — Progress Notes (Signed)
 [facility] Counselor/Therapist Progress Note  Patient ID: Scott [name], MRN: [id number]   Date: 02/22/24  Time Spent: 3:03 pm - 3:43 pm: 40 Minutes  Treatment Type: Individual Therapy.  Reported Symptoms: anxiety & depression.   Mental Status Exam: Appearance:  Casual     Behavior: Appropriate  Motor: Normal  Speech/Language:  Clear and Coherent  Affect: Flat  Mood: dysthymic  Thought process: normal  Thought content:   WNL  Sensory/Perceptual disturbances:   WNL  Orientation: oriented to person, place, time/date, and situation  Attention: Good  Concentration: Good  Memory: WNL  Fund of knowledge:  Good  Insight:   Good  Judgment:  Good  Impulse Control: Good   Risk Assessment: Danger to Self:  No Self-injurious Behavior: No Danger to Others: No Duty to Warn:no Physical Aggression / Violence:No  Access to Firearms a concern: No  Gang Involvement:No   Subjective:   Scott [name] participated from home, via video, is aware of the limitations of tele-session, and consented to treatment. Therapist participated from home office. Scott [name] discussed the events of the past week. He noted the recent passing of an acquaintance. We worked on processing this during the session and and the effect of this on Scott [name]'s mood. He noted being upset, sad, and shocked. He noted that "part of feels like I should be sadder". We worked on exploring this and processing his experience. Marque denied any concerns regarding his sleep, eating, or general functioning. He noted recently experiencing frustration with his video game and worked on taking breaks, as previously discussed. Therapist praised Scott [name] for his effort in this area. He noted a recent biopsy and noted being diagnosed with a skin issue and was provided medication. He is hopefully to grow his hair back but noted the aforementioned loss putting his skin issue in perspective. Therapist praised Scott [name] for adopting a  positive perspective. He noted engaging in enjoyable activities but, at times, being limited by finances. He noted his interest in getting a job and noted this would require a car and learning to drive. He noted his plan to work on identifying viable options, between sessions. Therapist praised Scott [name] for his effort during the session. Therapist provided psycho-education regarding grief and loss. A follow-up was scheduled for continued treatment, which he benefits from.   Interventions: CBT  & relaxation  Diagnosis:  Other specified anxiety disorders  Psychiatric Treatment: No , but would benefit from.   Treatment Plan:  Client Abilities/Strengths Scott [name] is self-aware and motivated for change.   Support System: Family  Client Treatment Preferences OPT  Client Statement of Needs Scott [name] would like to manage social anxiety, managing anger, improving sleep, manage symptoms, process events, manage distress, engage in self-care, challenge negative thoughts and feelings, develop a day-to-day rhythm.   Treatment Level Weekly  Symptoms  Anxiety: feeling nervous, difficulty managing worry, worrying about different things, trouble relaxing, irritability, feeling afraid something awful happen    (Status: maintained) Depression: loss of interest, difficulty falling asleep and hypersomnia (~9-10), lethargy, hyper-motor agitation. He presented with a flat affect. (Status: maintained)  Goals:   Scott [name] symptoms of depression and anxiety.   Treatment plan signed and available on s-drive:  No, pending signature.    Target Date: 06/05/24 Frequency: Weekly  Progress: 0 Modality: individual    Therapist Scott [name] provide referrals for additional resources as appropriate.  Therapist Scott [name] provide psycho-education regarding Scott [name]'s diagnosis and corresponding treatment approaches and interventions. Licensed Clinical Social Worker, [place], LCSW Scott [name] support the  patient's ability to  achieve the goals identified. Scott [name] employ CBT, BA, Problem-solving, Solution Focused, Mindfulness,  coping skills, & other evidenced-based practices Scott [name] be used to promote progress towards healthy functioning to help manage decrease symptoms associated with his diagnosis.   Reduce overall level, frequency, and intensity of the feelings of depression, anxiety and panic evidenced by decreased overall symptoms from 6 to 7 days/week to 0 to 1 days/week per client report for at least 3 consecutive months. Verbally express understanding of the relationship between feelings of depression, anxiety and their impact on thinking patterns and behaviors. Verbalize an understanding of the role that distorted thinking plays in creating fears, excessive worry, and ruminations.    Scott [name] participated in the creation of the treatment plan)   Delight Ovens, LCSW

## 2024-03-12 ENCOUNTER — Ambulatory Visit (INDEPENDENT_AMBULATORY_CARE_PROVIDER_SITE_OTHER): Admitting: Psychology

## 2024-03-12 DIAGNOSIS — F418 Other specified anxiety disorders: Secondary | ICD-10-CM

## 2024-03-12 NOTE — Progress Notes (Signed)
 [facility] Counselor/Therapist Progress Note  Patient ID: Scott [name], MRN: [id number]   Date: 03/12/24  Time Spent: 3:07 pm - 3:50 pm: 43 Minutes  Treatment Type: Individual Therapy.  Reported Symptoms: anxiety & depression.   Mental Status Exam: Appearance:  Casual     Behavior: Appropriate  Motor: Normal  Speech/Language:  Clear and Coherent  Affect: Flat  Mood: dysthymic  Thought process: normal  Thought content:   WNL  Sensory/Perceptual disturbances:   WNL  Orientation: oriented to person, place, time/date, and situation  Attention: Good  Concentration: Good  Memory: WNL  Fund of knowledge:  Good  Insight:   Good  Judgment:  Good  Impulse Control: Good   Risk Assessment: Danger to Self:  No Self-injurious Behavior: No Danger to Others: No Duty to Warn:no Physical Aggression / Violence:No  Access to Firearms a concern: No  Gang Involvement:No   Subjective:   [name] participated from home, via video, is aware of the limitations of tele-session, and consented to treatment. Therapist participated from home office. Elisabeth Pigeon discussed the events of the past week. He noted not being able to go outside as much due wasps. He noted his hope that this will be addressed soon. He noted that his apartment, overall, needs various repairs and noted a history of poor repairs by the management company.  He noted taking a hike with a friend and enjoying it and noted his intent to go on a hike again. He noted his continued attempts to address his hair loss. He noted his mother's reluctance to purchase the hair loss foam and [place]'s reluctance to address the issue to do his family being "hard headed". We worked on exploring this during the session.Marland Kitchen He noted the family's financial limitations and how this affects their day-to-day life including having to delay dental and medical care. He noted his interest to get a job and noted a need for some transportation and  noted his interest in getting a bike, in the mean time. We worked on identifying possible resources for this and to problem-solve barriers. Therapist validated Brendt's feelings and experience, during the session, provided supportive therapy, and a follow-up was scheduled.   Interventions: CBT   Diagnosis:  Other specified anxiety disorders  Psychiatric Treatment: No , but would benefit from.   Treatment Plan:  Client Abilities/Strengths Tymon is self-aware and motivated for change.   Support System: Family  Client Treatment Preferences OPT  Client Statement of Needs Isao would like to manage social anxiety, managing anger, improving sleep, manage symptoms, process events, manage distress, engage in self-care, challenge negative thoughts and feelings, develop a day-to-day rhythm.   Treatment Level Weekly  Symptoms  Anxiety: feeling nervous, difficulty managing worry, worrying about different things, trouble relaxing, irritability, feeling afraid something awful happen    (Status: maintained) Depression: loss of interest, difficulty falling asleep and hypersomnia (~9-10), lethargy, hyper-motor agitation. He presented with a flat affect. (Status: maintained)  Goals:   Sadao experiences symptoms of depression and anxiety.   Treatment plan signed and available on s-drive:  No, pending signature.    Target Date: 06/05/24 Frequency: Weekly  Progress: 0 Modality: individual    Therapist will provide referrals for additional resources as appropriate.  Therapist will provide psycho-education regarding Sarvesh's diagnosis and corresponding treatment approaches and interventions. Licensed Clinical Social Worker, [place], LCSW will support the patient's ability to achieve the goals identified. will employ CBT, BA, Problem-solving, Solution Focused, Mindfulness,  coping skills, & other evidenced-based practices  will be used to promote progress towards healthy functioning to  help manage decrease symptoms associated with his diagnosis.   Reduce overall level, frequency, and intensity of the feelings of depression, anxiety and panic evidenced by decreased overall symptoms from 6 to 7 days/week to 0 to 1 days/week per client report for at least 3 consecutive months. Verbally express understanding of the relationship between feelings of depression, anxiety and their impact on thinking patterns and behaviors. Verbalize an understanding of the role that distorted thinking plays in creating fears, excessive worry, and ruminations.    Noralee Chars participated in the creation of the treatment plan)   Delight Ovens, LCSW

## 2024-04-03 ENCOUNTER — Ambulatory Visit: Admitting: Psychology

## 2024-04-12 ENCOUNTER — Ambulatory Visit: Admitting: Psychology

## 2024-04-12 DIAGNOSIS — F418 Other specified anxiety disorders: Secondary | ICD-10-CM | POA: Diagnosis not present

## 2024-04-12 NOTE — Progress Notes (Signed)
 [facility] Counselor/Therapist Progress Note  Patient ID: Scott [name], MRN: [id number]   Date: 04/12/24  Time Spent: 2:05  pm - 2:46 pm: 41 Minutes  Treatment Type: Individual Therapy.  Reported Symptoms: anxiety & depression.   Mental Status Exam: Appearance:  Casual     Behavior: Appropriate  Motor: Normal  Speech/Language:  Clear and Coherent  Affect: Flat  Mood: dysthymic  Thought process: normal  Thought content:   WNL  Sensory/Perceptual disturbances:   WNL  Orientation: oriented to person, place, time/date, and situation  Attention: Good  Concentration: Good  Memory: WNL  Fund of knowledge:  Good  Insight:   Good  Judgment:  Good  Impulse Control: Good   Risk Assessment: Danger to Self:  No Self-injurious Behavior: No Danger to Others: No Duty to Warn:no Physical Aggression / Violence:No  Access to Firearms a concern: No  Gang Involvement:No   Subjective:   Scott [name] participated from home, via video, is aware of the limitations of tele-session, and consented to treatment. Therapist participated from home office. Scott [name] discussed the events of the past week. He noted an increase in his anxiety during the past month and noted rescheduling due to illness. He noted contributing factors to his anxiety included being sick, feeling lightheaded, and having "constant headaches", Additional possible factors include "seeing my own blood. He noted feeling anxious and panicky due to feeling lightheaded. He noted worry that he would black out and has in the past, while sick. He noted that prior to his illness, his anxiety was less severe and more manageable.  We worked on process his anxiety and attempts to address anxiety. He noted his anxiety rising when he saw his own blood and noted his effort to manage his negative self-talk and anxious talk. Therapist praised Scott [name] for his effort in this area. Therapist encourged continued mindfulness in this area  and challenging his negative self-talk, engage in self-soothing. He has been spending time engaging in self-care to improve his mood via healthful eating and exercise. He noted some frustration regarding an interaction with his mother and noted that his irritation will often become a barrier to task completion. We worked on exploring this during the session. We worked on identifying ways to provide feedback positively and assertively. Therapist modeled this during the session. Scott [name] was engaged in the session. He expresses his commitment towards session goals. Therapist praised Scott [name] for his effort and provided supportive therapy. A follow-up was scheduled for continued treatment.   Interventions: CBT   Diagnosis:  Other specified anxiety disorders  Psychiatric Treatment: No , but would benefit from.   Treatment Plan:  Client Abilities/Strengths Scott [name] is self-aware and motivated for change.   Support System: Family  Client Treatment Preferences OPT  Client Statement of Needs Scott [name] would like to manage social anxiety, managing anger, improving sleep, manage symptoms, process events, manage distress, engage in self-care, challenge negative thoughts and feelings, develop a day-to-day rhythm.   Treatment Level Weekly  Symptoms  Anxiety: feeling nervous, difficulty managing worry, worrying about different things, trouble relaxing, irritability, feeling afraid something awful happen    (Status: maintained) Depression: loss of interest, difficulty falling asleep and hypersomnia (~9-10), lethargy, hyper-motor agitation. He presented with a flat affect. (Status: maintained)  Goals:   Scott [name] experiences symptoms of depression and anxiety.   Treatment plan signed and available on s-drive:  No, pending signature.    Target Date: 06/05/24 Frequency: Weekly  Progress: 0 Modality: individual    Therapist will provide  referrals for additional resources as appropriate.  Therapist will  provide psycho-education regarding Scott [name]'s diagnosis and corresponding treatment approaches and interventions. Licensed Clinical Social Worker, [place], LCSW will support the patient's ability to achieve the goals identified. will employ CBT, BA, Problem-solving, Solution Focused, Mindfulness,  coping skills, & other evidenced-based practices will be used to promote progress towards healthy functioning to help manage decrease symptoms associated with his diagnosis.   Reduce overall level, frequency, and intensity of the feelings of depression, anxiety and panic evidenced by decreased overall symptoms from 6 to 7 days/week to 0 to 1 days/week per client report for at least 3 consecutive months. Verbally express understanding of the relationship between feelings of depression, anxiety and their impact on thinking patterns and behaviors. Verbalize an understanding of the role that distorted thinking plays in creating fears, excessive worry, and ruminations.    (Scott [name] participated in the creation of the treatment plan)   Belva Boyden, LCSW

## 2024-04-24 ENCOUNTER — Other Ambulatory Visit: Payer: Self-pay | Admitting: Medical

## 2024-04-24 NOTE — Telephone Encounter (Signed)
 I made Appt made for next week, pt needs refill before his appt

## 2024-04-24 NOTE — Telephone Encounter (Signed)
 Patient is overdue for a visit. Pt will need to schedule before I can refill for 30 days

## 2024-05-02 ENCOUNTER — Encounter: Payer: Self-pay | Admitting: Medical

## 2024-05-02 ENCOUNTER — Ambulatory Visit: Admitting: Medical

## 2024-05-02 VITALS — BP 122/80 | HR 74 | Ht 66.0 in | Wt 176.0 lb

## 2024-05-02 DIAGNOSIS — K219 Gastro-esophageal reflux disease without esophagitis: Secondary | ICD-10-CM | POA: Diagnosis not present

## 2024-05-02 DIAGNOSIS — F419 Anxiety disorder, unspecified: Secondary | ICD-10-CM | POA: Insufficient documentation

## 2024-05-02 DIAGNOSIS — Z0000 Encounter for general adult medical examination without abnormal findings: Secondary | ICD-10-CM | POA: Insufficient documentation

## 2024-05-02 MED ORDER — FAMOTIDINE 40 MG PO TABS: 40.0000 mg | ORAL_TABLET | Freq: Every day | ORAL | 2 refills | Status: AC

## 2024-05-02 MED ORDER — VITAMIN D 50 MCG (2000 UT) PO CAPS: 1.0000 | ORAL_CAPSULE | Freq: Every day | ORAL | 3 refills | Status: AC

## 2024-05-02 MED ORDER — PANTOPRAZOLE SODIUM 40 MG PO TBEC: 40.0000 mg | DELAYED_RELEASE_TABLET | Freq: Every day | ORAL | 2 refills | Status: AC

## 2024-05-02 NOTE — Progress Notes (Signed)
 Subjective:   HPI  Scott [name] is a 23 y.o. male who presents for Chief Complaint  Patient presents with   Annual Exam    Nonfasting cpe, no concerns    Patient Care Team: Izabel Chim, Christiane Cowing, PA-C as PCP - General (Family Medicine) Pyrtle, Amber Bail, MD as Consulting Physician (Gastroenterology) Belva Boyden, LCSW as Social Worker (Psychology)   Concerns: Here for well visit.  He continues on famotidine  and Protonix  regularly, feels like he needs to stay on these.  He is compliant with vitamin D  supplement daily.  Seeing counseling every 3 weeks regarding bad anxiety.   Reviewed their medical, surgical, family, social, medication, and allergy history and updated chart as appropriate.  No Known Allergies  Past Medical History:  Diagnosis Date   Acid reflux    Anxiety     Current Outpatient Medications on File Prior to Visit  Medication Sig Dispense Refill   augmented betamethasone dipropionate (DIPROLENE-AF) 0.05 % ointment Apply topically daily.     ketoconazole  (NIZORAL ) 2 % shampoo APPLY TOPICALLY 2 TIMES A WEEK 120 mL 6   No current facility-administered medications on file prior to visit.      Current Outpatient Medications:    augmented betamethasone dipropionate (DIPROLENE-AF) 0.05 % ointment, Apply topically daily., Disp: , Rfl:    ketoconazole  (NIZORAL ) 2 % shampoo, APPLY TOPICALLY 2 TIMES A WEEK, Disp: 120 mL, Rfl: 6   Cholecalciferol (VITAMIN D ) 50 MCG (2000 UT) CAPS, Take 1 capsule (2,000 Units total) by mouth daily., Disp: 90 capsule, Rfl: 3   famotidine  (PEPCID ) 40 MG tablet, Take 1 tablet (40 mg total) by mouth daily., Disp: 90 tablet, Rfl: 2   pantoprazole  (PROTONIX ) 40 MG tablet, Take 1 tablet (40 mg total) by mouth daily., Disp: 90 tablet, Rfl: 2  Family History  Problem Relation Age of Onset   Anxiety disorder Mother    Hypertension Mother    Arrhythmia Mother    Colon cancer Neg Hx    Esophageal cancer Neg Hx    Pancreatic cancer Neg Hx     Stomach cancer Neg Hx    Liver disease Neg Hx     Past Surgical History:  Procedure Laterality Date   ESOPHAGEAL DILATION  06/2021   ESOPHAGOGASTRODUODENOSCOPY  06/2021   Review of Systems  Constitutional:  Negative for chills, fever, malaise/fatigue and weight loss.  HENT:  Negative for congestion, ear pain, hearing loss, sore throat and tinnitus.   Eyes:  Negative for blurred vision, pain and redness.  Respiratory:  Negative for cough, hemoptysis and shortness of breath.   Cardiovascular:  Negative for chest pain, palpitations, orthopnea, claudication and leg swelling.  Gastrointestinal:  Negative for abdominal pain, blood in stool, constipation, diarrhea, nausea and vomiting.  Genitourinary:  Negative for dysuria, flank pain, frequency, hematuria and urgency.  Musculoskeletal:  Negative for falls, joint pain and myalgias.  Skin:  Negative for itching and rash.  Neurological:  Negative for dizziness, tingling, speech change, weakness and headaches.  Endo/Heme/Allergies:  Negative for polydipsia. Does not bruise/bleed easily.  Psychiatric/Behavioral:  Negative for depression and memory loss. The patient is not nervous/anxious and does not have insomnia.       Objective:  BP 122/80   Pulse 74   Ht 5\' 6"  (1.676 m)   Wt 176 lb (79.8 kg)   SpO2 99%   BMI 28.41 kg/m   General appearance: alert, no distress, WD/WN, African American male Skin: Unremarkable HEENT: normocephalic, conjunctiva/corneas normal, sclerae anicteric, PERRLA, EOMi, nares  patent, no discharge or erythema, pharynx normal Oral cavity: MMM, tongue normal, teeth in good repair Neck: supple, no lymphadenopathy, no thyromegaly, no masses, normal ROM, no bruits Chest: non tender, normal shape and expansion Heart: RRR, normal S1, S2, no murmurs Lungs: CTA bilaterally, no wheezes, rhonchi, or rales Abdomen: +bs, soft, non tender, non distended, no masses, no hepatomegaly, no splenomegaly, no bruits Back: non  tender, normal ROM, no scoliosis Musculoskeletal: upper extremities non tender, no obvious deformity, normal ROM throughout, lower extremities non tender, no obvious deformity, normal ROM throughout Extremities: no edema, no cyanosis, no clubbing Pulses: 2+ symmetric, upper and lower extremities, normal cap refill Neurological: alert, oriented x 3, CN2-12 intact, strength normal upper extremities and lower extremities, sensation normal throughout, DTRs 2+ throughout, no cerebellar signs, gait normal Psychiatric: normal affect, behavior normal, pleasant  GU/rectal - deferred, declined     Assessment and Plan :   Encounter Diagnoses  Name Primary?   Encounter for health maintenance examination in adult Yes   Anxiety    Gastroesophageal reflux disease, unspecified whether esophagitis present     This visit was a preventative care visit, also known as wellness visit or routine physical.   Topics typically include healthy lifestyle, diet, exercise, preventative care, vaccinations, sick and well care, proper use of emergency dept and after hours care, as well as other concerns.     Separate significant issues discussed: Anxiety concerns, social anxiety concerns- continue with counseling  GERD- doing ok on current therapy    General Recommendations: Continue to return yearly for your annual wellness and preventative care visits.  This gives us  a chance to discuss healthy lifestyle, exercise, vaccinations, review your chart record, and perform screenings where appropriate.  I recommend you see your eye doctor yearly for routine vision care.  I recommend you see your dentist yearly for routine dental care including hygiene visits twice yearly.   Vaccination  Immunization History  Administered Date(s) Administered   DTaP 11/11/1999, 02/24/2000, 04/26/2000, 01/25/2001, 06/04/2004   HIB (PRP-OMP) 11/11/1999, 02/24/2000, 01/25/2001   HPV 9-valent 02/11/2012, 08/02/2014   Hepatitis A  08/09/2007, 08/13/2008   Hepatitis B 11/11/1999, 02/24/2000, 01/25/2001   IPV 11/11/1999, 02/24/2000, 01/25/2001, 06/04/2004   Influenza-Unspecified 10/27/2005, 10/11/2009   MMR 11/23/2000, 06/04/2004   Meningococcal Conjugate 01/14/2011   PFIZER(Purple Top)SARS-COV-2 Vaccination 12/20/2020   Pneumococcal Conjugate-13 11/11/1999, 02/24/2000, 04/26/2000, 01/25/2001   Td 01/14/2011   Tdap 01/14/2011, 03/18/2023   Varicella 11/23/2000, 08/09/2007    Screening for cancer: Colon cancer screening: Age 66  Testicular cancer screening You should do a monthly self testicular exam if you are between 13-32 years old, and we typically do a testicular exam on the yearly physical for this same age group.   Prostate Cancer screening: The recommended prostate cancer screening test is a blood test called the prostate-specific antigen (PSA) test. PSA is a protein that is made in the prostate. As you age, your prostate naturally produces more PSA. Abnormally high PSA levels may be caused by: Prostate cancer. An enlarged prostate that is not caused by cancer (benign prostatic hyperplasia, or BPH). This condition is very common in older men. A prostate gland infection (prostatitis) or urinary tract infection. Certain medicines such as male hormones (like testosterone) or other medicines that raise testosterone levels. A rectal exam may be done as part of prostate cancer screening to help provide information about the size of your prostate gland. When a rectal exam is performed, it should be done after the PSA level is  drawn to avoid any effect on the results.   Skin cancer screening: Check your skin regularly for new changes, growing lesions, or other lesions of concern Come in for evaluation if you have skin lesions of concern.   Lung cancer screening: If you have a greater than 20 pack year history of tobacco use, then you may qualify for lung cancer screening with a chest CT scan.   Please call your  insurance company to inquire about coverage for this test.   Pancreatic cancer:  no current screening test is available or routinely recommended. (risk factors: smoking, overweight or obese, diabetes, chronic pancreatitis, work exposure - dry cleaning, metal working, 25yo>, M>F, Tree surgeon, family hx/o, hereditary breast, ovarian, melanoma, lynch, peutz-jeghers).  Symptoms: jaundice, dark urine, light color or greasy stools, itchy skin, belly or back pain, weight loss, poor appetite, nause, vomiting, liver enlargement, DVT/blood clots.   We currently don't have screenings for other cancers besides breast, cervical, colon, and lung cancers.  If you have a strong family history of cancer or have other cancer screening concerns, please let me know.  Genetic testing referral is an option for individuals with high cancer risk in the family.  There are some other cancer screenings in development currently.   Bone health: Get at least 150 minutes of aerobic exercise weekly Get weight bearing exercise at least once weekly   Heart health: Get at least 150 minutes of aerobic exercise weekly Limit alcohol It is important to maintain a healthy blood pressure and healthy cholesterol numbers  Heart disease screening: Screening for heart disease includes screening for blood pressure, fasting lipids, glucose/diabetes screening, BMI height to weight ratio, reviewed of smoking status, physical activity, and diet.    Goals include blood pressure 120/80 or less, maintaining a healthy lipid/cholesterol profile, preventing diabetes or keeping diabetes numbers under good control, not smoking or using tobacco products, exercising most days per week or at least 150 minutes per week of exercise, and eating healthy variety of fruits and vegetables, healthy oils, and avoiding unhealthy food choices like fried food, fast food, high sugar and high cholesterol foods.      Neng was seen today for annual  exam.  Diagnoses and all orders for this visit:  Encounter for health maintenance examination in adult  Anxiety  Gastroesophageal reflux disease, unspecified whether esophagitis present  Other orders -     Cholecalciferol (VITAMIN D ) 50 MCG (2000 UT) CAPS; Take 1 capsule (2,000 Units total) by mouth daily. -     famotidine  (PEPCID ) 40 MG tablet; Take 1 tablet (40 mg total) by mouth daily. -     pantoprazole  (PROTONIX ) 40 MG tablet; Take 1 tablet (40 mg total) by mouth daily.    Follow-up  yearly for physical

## 2024-05-03 ENCOUNTER — Ambulatory Visit (INDEPENDENT_AMBULATORY_CARE_PROVIDER_SITE_OTHER): Admitting: Psychology

## 2024-05-03 DIAGNOSIS — F418 Other specified anxiety disorders: Secondary | ICD-10-CM | POA: Diagnosis not present

## 2024-05-03 NOTE — Progress Notes (Signed)
 [facility] Counselor/Therapist Progress Note  Patient ID: Scott [name], MRN: [id number]   Date: 05/03/24  Time Spent: 3:03  pm - 3:50 pm:  47 Minutes  Treatment Type: Individual Therapy.  Reported Symptoms: anxiety & depression.   Mental Status Exam: Appearance:  Casual     Behavior: Appropriate  Motor: Normal  Speech/Language:  Clear and Coherent  Affect: Flat  Mood: dysthymic  Thought process: normal  Thought content:   WNL  Sensory/Perceptual disturbances:   WNL  Orientation: oriented to person, place, time/date, and situation  Attention: Good  Concentration: Good  Memory: WNL  Fund of knowledge:  Good  Insight:   Good  Judgment:  Good  Impulse Control: Good   Risk Assessment: Danger to Self:  No Self-injurious Behavior: No Danger to Others: No Duty to Warn:no Physical Aggression / Violence:No  Access to Firearms a concern: No  Gang Involvement:No   Subjective:   Scott [name] participated from home, via video, is aware of the limitations of tele-session, and consented to treatment. Therapist participated from home office. Scott [name] discussed the events of the past week. He noted experiencing various minor irritants during the week and this being difficult to manage. He noted frustrations with a hobby and with cooking. He noted feeling angry, disappointed, and "feeling like I could have done better". We worked one exploring his expectations during the session, proactively setting balanced expectations, allowing for flexibility, and employing distress tolerance. He noted having to delay his labs at his provider due to their request to draw blood. He noted previously popping a blood blister and backing out. He will work on documenting various details of when he blacked out and feel activated. We will work one exploring this during the session. He noted this being a barrier to his blood draw. He noted anxiety regarding the combination of stimuli, seeing blood  and feeling pain or feeling the blood draw. He discussed that seeing the blood alone doesn't appear to be a trigger. He noted that reading about the blood draw process doesn't appear to trigger him. We continued exploring this during the session and will continue in the follow-up. Therapist validated Scott [name]'s experience, encouraged him to complete his assignment between sessions and provided supportive therapy. A follow-up was scheduled for continued treatment which he benefits from.  Interventions: CBT   Diagnosis:  Other specified anxiety disorders  Psychiatric Treatment: No , but would benefit from.   Treatment Plan:  Client Abilities/Strengths Scott [name] is self-aware and motivated for change.   Support System: Family  Client Treatment Preferences OPT  Client Statement of Needs Scott [name] would like to manage social anxiety, managing anger, improving sleep, manage symptoms, process events, manage distress, engage in self-care, challenge negative thoughts and feelings, develop a day-to-day rhythm.   Treatment Level Weekly  Symptoms  Anxiety: feeling nervous, difficulty managing worry, worrying about different things, trouble relaxing, irritability, feeling afraid something awful happen    (Status: maintained) Depression: loss of interest, difficulty falling asleep and hypersomnia (~9-10), lethargy, hyper-motor agitation. He presented with a flat affect. (Status: maintained)  Goals:   Scott [name] experiences symptoms of depression and anxiety.   Treatment plan signed and available on s-drive:  No, pending signature.    Target Date: 06/05/24 Frequency: Weekly  Progress: 10% Modality: individual    Therapist will provide referrals for additional resources as appropriate.  Therapist will provide psycho-education regarding Scott [name] diagnosis and corresponding treatment approaches and interventions. Licensed Clinical Social Worker, [place], LCSW will support the patient's ability  to  achieve the goals identified. will employ CBT, BA, Problem-solving, Solution Focused, Mindfulness,  coping skills, & other evidenced-based practices will be used to promote progress towards healthy functioning to help manage decrease symptoms associated with his diagnosis.   Reduce overall level, frequency, and intensity of the feelings of depression, anxiety and panic evidenced by decreased overall symptoms from 6 to 7 days/week to 0 to 1 days/week per client report for at least 3 consecutive months. Verbally express understanding of the relationship between feelings of depression, anxiety and their impact on thinking patterns and behaviors. Verbalize an understanding of the role that distorted thinking plays in creating fears, excessive worry, and ruminations.    (Stepen participated in the creation of the treatment plan)   Belva Boyden, LCSW

## 2024-05-23 ENCOUNTER — Other Ambulatory Visit: Payer: Self-pay | Admitting: Medical

## 2024-05-23 NOTE — Telephone Encounter (Signed)
   This was already refilled on 05/02/24 #90 with 2 refills.

## 2024-05-25 ENCOUNTER — Ambulatory Visit: Admitting: Psychology

## 2024-05-29 ENCOUNTER — Other Ambulatory Visit: Payer: Self-pay | Admitting: Medical

## 2024-05-29 DIAGNOSIS — L219 Seborrheic dermatitis, unspecified: Secondary | ICD-10-CM

## 2024-06-11 ENCOUNTER — Encounter: Admitting: Psychology

## 2024-06-11 NOTE — Progress Notes (Signed)
 This encounter was created in error - please disregard.

## 2024-07-03 ENCOUNTER — Ambulatory Visit (INDEPENDENT_AMBULATORY_CARE_PROVIDER_SITE_OTHER): Admitting: Psychology

## 2024-07-03 DIAGNOSIS — F418 Other specified anxiety disorders: Secondary | ICD-10-CM

## 2024-07-03 NOTE — Progress Notes (Signed)
 [facility] Counselor/Therapist Progress Note  Patient ID: Scott [name], MRN: [id number]   Date: 07/03/24  Time Spent: 3:03  pm - 3:50 pm:  47 Minutes  Treatment Type: Individual Therapy.  Reported Symptoms: anxiety & depression.   Mental Status Exam: Appearance:  Casual     Behavior: Appropriate  Motor: Normal  Speech/Language:  Clear and Coherent  Affect: Flat  Mood: dysthymic  Thought process: normal  Thought content:   WNL  Sensory/Perceptual disturbances:   WNL  Orientation: oriented to person, place, time/date, and situation  Attention: Good  Concentration: Good  Memory: WNL  Fund of knowledge:  Good  Insight:   Good  Judgment:  Good  Impulse Control: Good   Risk Assessment: Danger to Self:  No Self-injurious Behavior: No Danger to Others: No Duty to Warn:no Physical Aggression / Violence:No  Access to Firearms a concern: No  Gang Involvement:No   Subjective:   Breckan Haak participated from home, via video, is aware of the limitations of tele-session, and consented to treatment. Therapist participated from office. [name] discussed the events of the past week. Alexandro noted missing the most recent appointment due to oversleeping. He noted various medical appointments that required rescheduling various appointments including dental care and dermatology. Landan noted a need to work on addressing his social anxiety. He noted his mother and sister dragging him outside of the house and noted his anxiety rising. He noted that leaving the house  and being around people he doesn't know. He noted some possible contributing factors including his hair loss, not knowing what to say, not feeling social, people's perception of his facial expressions and mannerisms, less opportunities to social size. We worked on identifying his possible somatic symptoms including fidgeting, difficulty sitting still, slight sinking feeling in my stomach, increased heart rate,  muscle tension. He noted some negative self-talk including something bad is going to happen and noted generally over-thinking things including how he walks. We worked on exploring and processing his experience during the session. Therapist provided psycho-education regarding anxiety and panic symptoms. Calen was engaged and motivated and expressed commitment towards goals. Therapist praised Paula and provided supportive therapy. A follow-up was scheduled for continued treatment, which he benefits from.   Interventions: CBT   Diagnosis:  Other specified anxiety disorders  Psychiatric Treatment: No , but would benefit from.   Treatment Plan:  Client Abilities/Strengths Gevon is self-aware and motivated for change.   Support System: Family  Client Treatment Preferences OPT  Client Statement of Needs Albi would like to manage social anxiety, managing anger, improving sleep, manage symptoms, process events, manage distress, engage in self-care, challenge negative thoughts and feelings, develop a day-to-day rhythm.   Treatment Level Weekly  Symptoms  Anxiety: feeling nervous, difficulty managing worry, worrying about different things, trouble relaxing, irritability, feeling afraid something awful happen    (Status: maintained) Depression: loss of interest, difficulty falling asleep and hypersomnia (~9-10), lethargy, hyper-motor agitation. He presented with a flat affect. (Status: maintained)  Goals:   Bexton experiences symptoms of depression and anxiety.   Treatment plan signed and available on s-drive:  No, pending signature.    Target Date: 07/05/24 Frequency: Weekly  Progress: 10% Modality: individual    Therapist will provide referrals for additional resources as appropriate.  Therapist will provide psycho-education regarding Kashus's diagnosis and corresponding treatment approaches and interventions. Licensed Clinical Social Worker, [place], LCSW will support  the patient's ability to achieve the goals identified. will employ CBT, BA, Problem-solving, Solution Focused,  Mindfulness,  coping skills, & other evidenced-based practices will be used to promote progress towards healthy functioning to help manage decrease symptoms associated with his diagnosis.   Reduce overall level, frequency, and intensity of the feelings of depression, anxiety and panic evidenced by decreased overall symptoms from 6 to 7 days/week to 0 to 1 days/week per client report for at least 3 consecutive months. Verbally express understanding of the relationship between feelings of depression, anxiety and their impact on thinking patterns and behaviors. Verbalize an understanding of the role that distorted thinking plays in creating fears, excessive worry, and ruminations.    (Kemper participated in the creation of the treatment plan)   [name], LCSW

## 2024-07-20 ENCOUNTER — Ambulatory Visit (INDEPENDENT_AMBULATORY_CARE_PROVIDER_SITE_OTHER): Admitting: Psychology

## 2024-07-20 DIAGNOSIS — F418 Other specified anxiety disorders: Secondary | ICD-10-CM | POA: Diagnosis not present

## 2024-07-20 NOTE — Progress Notes (Signed)
  Behavioral Health Counselor/Therapist Progress Note  Patient ID: Scott [name], MRN: [id number]   Date: 07/20/24  Time Spent: 2:03  pm - 2:31 pm:  28 Minutes  Treatment Type: Individual Therapy.  Reported Symptoms: anxiety & depression.   Mental Status Exam: Appearance:  Casual     Behavior: Appropriate  Motor: Normal  Speech/Language:  Clear and Coherent  Affect: Flat  Mood: dysthymic  Thought process: normal  Thought content:   WNL  Sensory/Perceptual disturbances:   WNL  Orientation: oriented to person, place, time/date, and situation  Attention: Good  Concentration: Good  Memory: WNL  Fund of knowledge:  Good  Insight:   Good  Judgment:  Good  Impulse Control: Good   Risk Assessment: Danger to Self:  No Self-injurious Behavior: No Danger to Others: No Duty to Warn:no Physical Aggression / Violence:No  Access to Firearms a concern: No  Gang Involvement:No   Subjective:   Scott [name] participated from home, via video, is aware of the limitations of tele-session, and consented to treatment. Therapist participated from office. Scott [name] discussed the events of the past week. Scott [name]  noted working on challenging himself to leave the home and go grocery shopping with his family. He noted interest in doing this more often. Therapist praised Scott [name] for his intent to manage his anxiety leaving the home and encouraged continued and consistent effort in this area. We highlighted this goal aligning with with his goals of getting a job, procuring transportation, and being able to be more autonomous. We discussed proactive distress management during these times. He is working on procuring a job and is awaiting a Occupational psychologist. He noted feeling sick and discussed the effect of this on his mood. He noted often being annoyed or irritable. We worked on processing this during the session. Therapist validated Scott [name]'s feelings and experience during the session. Therapist provided  supportive therapy. A follow-up was scheduled for continued treatment.   Interventions: CBT   Diagnosis:  Other specified anxiety disorders  Psychiatric Treatment: No , but would benefit from.   Treatment Plan:  Client Abilities/Strengths Scott [name] is self-aware and motivated for change.   Support System: Family  Client Treatment Preferences OPT  Client Statement of Needs Scott [name] would like to manage social anxiety, managing anger, improving sleep, manage symptoms, process events, manage distress, engage in self-care, challenge negative thoughts and feelings, develop a day-to-day rhythm.   Treatment Level Weekly  Symptoms  Anxiety: feeling nervous, difficulty managing worry, worrying about different things, trouble relaxing, irritability, feeling afraid something awful happen    (Status: maintained) Depression: loss of interest, difficulty falling asleep and hypersomnia (~9-10), lethargy, hyper-motor agitation. He presented with a flat affect. (Status: maintained)  Goals:   Scott [name] experiences symptoms of depression and anxiety.   Treatment plan signed and available on s-drive:  No, pending signature.    Target Date: 08/05/24 Frequency: Weekly  Progress: 10% Modality: individual    Therapist will provide referrals for additional resources as appropriate.  Therapist will provide psycho-education regarding Scott [name]'s diagnosis and corresponding treatment approaches and interventions. Licensed Clinical Social Worker, [place], LCSW will support the patient's ability to achieve the goals identified. will employ CBT, BA, Problem-solving, Solution Focused, Mindfulness,  coping skills, & other evidenced-based practices will be used to promote progress towards healthy functioning to help manage decrease symptoms associated with his diagnosis.   Reduce overall level, frequency, and intensity of the feelings of depression, anxiety and panic evidenced by decreased overall symptoms from  6 to 7 days/week  to 0 to 1 days/week per client report for at least 3 consecutive months. Verbally express understanding of the relationship between feelings of depression, anxiety and their impact on thinking patterns and behaviors. Verbalize an understanding of the role that distorted thinking plays in creating fears, excessive worry, and ruminations.    (Scott [name] participated in the creation of the treatment plan)   Scott Mullet, LCSW

## 2024-08-10 ENCOUNTER — Ambulatory Visit: Admitting: Psychology

## 2024-08-10 DIAGNOSIS — F418 Other specified anxiety disorders: Secondary | ICD-10-CM

## 2024-08-10 NOTE — Progress Notes (Signed)
 [name] Behavioral Health Counselor/Therapist Progress Note  Patient ID: Scott [name], MRN: [id number]   Date: 08/10/24  Time Spent: 1:06  pm - 1:56 pm:  50 Minutes  Treatment Type: Individual Therapy.  Reported Symptoms: anxiety & depression.   Mental Status Exam: Appearance:  Casual     Behavior: Appropriate  Motor: Normal  Speech/Language:  Clear and Coherent  Affect: Flat  Mood: dysthymic  Thought process: normal  Thought content:   WNL  Sensory/Perceptual disturbances:   WNL  Orientation: oriented to person, place, time/date, and situation  Attention: Good  Concentration: Good  Memory: WNL  Fund of knowledge:  Good  Insight:   Good  Judgment:  Good  Impulse Control: Good   Risk Assessment: Danger to Self:  No Self-injurious Behavior: No Danger to Others: No Duty to Warn:no Physical Aggression / Violence:No  Access to Firearms a concern: No  Gang Involvement:No   Subjective:   Scott [name] participated from home, via video, is aware of the limitations of tele-session, and consented to treatment. Therapist participated from home office. Scott [name] discussed the events of the past week. He noted it being a slow couple of weeks. He noted a continued need to be more active. He noted disturbed sleep during the past week due to various interruptions that were out of his control. He noted a need to make improvement in this area. He noted his upcoming birthday and noted his father's previously attending Scott [name]'s sister's birthday but skipping his birthday without notice. He noted how this affected him and discussed his previous attempts, in the past, to gain a better understanding of his father's choice to no avail. We processed this during the session. We worked on identifying how he would like to proceed with this issue. Scott [name] noted feeling uncertain. We began processing this during the session. Therapist encouraged Scott [name] to work on exploring this and the various options  that are available to him. WE will continue to process this going forward. Scott [name] was engaged and motivated during the session. He expressed commitment towards goals. Therapist praised Scott [name] and provided supportive therapy. A follow-up was scheduled for continued treatment, which Scott [name] benefits from.   Interventions: CBT & interpersonal.   Diagnosis:  Other specified anxiety disorders  Psychiatric Treatment: No , but would benefit from.   Treatment Plan:  Client Abilities/Strengths Scott [name] is self-aware and motivated for change.   Support System: Family  Client Treatment Preferences OPT  Client Statement of Needs Scott [name] would like to manage social anxiety, managing anger, improving sleep, manage symptoms, process events, manage distress, engage in self-care, challenge negative thoughts and feelings, develop a day-to-day rhythm.   Treatment Level Weekly  Symptoms  Anxiety: feeling nervous, difficulty managing worry, worrying about different things, trouble relaxing, irritability, feeling afraid something awful happen    (Status: maintained) Depression: loss of interest, difficulty falling asleep and hypersomnia (~9-10), lethargy, hyper-motor agitation. He presented with a flat affect. (Status: maintained)  Goals:   [name] experiences symptoms of depression and anxiety.   Treatment plan signed and available on s-drive:  No, pending signature.    Target Date: 08/11/24 Frequency: Weekly  Progress: 10% Modality: individual    Therapist will provide referrals for additional resources as appropriate.  Therapist will provide psycho-education regarding Scott [name]'s diagnosis and corresponding treatment approaches and interventions. Licensed Clinical Social Worker, [place], LCSW will support the patient's ability to achieve the goals identified. will employ CBT, BA, Problem-solving, Solution Focused, Mindfulness,  coping skills, & other evidenced-based practices will be  used to  promote progress towards healthy functioning to help manage decrease symptoms associated with his diagnosis.   Reduce overall level, frequency, and intensity of the feelings of depression, anxiety and panic evidenced by decreased overall symptoms from 6 to 7 days/week to 0 to 1 days/week per client report for at least 3 consecutive months. Verbally express understanding of the relationship between feelings of depression, anxiety and their impact on thinking patterns and behaviors. Verbalize an understanding of the role that distorted thinking plays in creating fears, excessive worry, and ruminations.    (Scott [name] participated in the creation of the treatment plan)   Scott Mullet, LCSW

## 2024-08-31 ENCOUNTER — Ambulatory Visit: Admitting: Psychology

## 2024-09-11 ENCOUNTER — Ambulatory Visit (INDEPENDENT_AMBULATORY_CARE_PROVIDER_SITE_OTHER): Admitting: Psychology

## 2024-09-11 DIAGNOSIS — F418 Other specified anxiety disorders: Secondary | ICD-10-CM | POA: Diagnosis not present

## 2024-09-11 NOTE — Progress Notes (Signed)
 [facility] Behavioral [facility] Counselor Initial Adult Exam  Name: Scott Dokken Date: 09/11/2024 MRN: [id number] DOB: 09-15-2001 PCP: Bulah Alm RAMAN, PA-C  Time Spent: 3:02 pm - 3:44 pm : 42 Minutes  Guardian/Payee:  self    Paperwork requested: No   Reason for Visit /Presenting Problem: Depression and anxiety.   Mental Status Exam: Appearance:   Casual     Behavior:  Appropriate  Motor:  Normal  Speech/Language:   Clear and Coherent  Affect:  Flat  Mood:  dysthymic  Thought process:  normal  Thought content:    WNL  Sensory/Perceptual disturbances:    WNL  Orientation:  oriented to person, place, time/date, and situation  Attention:  Good  Concentration:  Good  Memory:  WNL  Fund of knowledge:   Good  Insight:    Good  Judgment:   Good  Impulse Control:  Good   Reported Symptoms:  Anxiety & depression.   Risk Assessment: Danger to Self:  No Self-injurious Behavior: No Danger to Others: No Duty to Warn:no Physical Aggression / Violence:No  Access to Firearms a concern: No  Gang Involvement:No  Patient / guardian was educated about steps to take if suicide or homicide risk level increases between visits: no While future psychiatric events cannot be accurately predicted, the patient does not currently require acute inpatient psychiatric care and does not currently meet [facility]  involuntary commitment criteria.  Substance Abuse History: Current substance abuse: No     Caffeine: None.  Tobacco: None. Alcohol: None. Substance use: None.   Past Psychiatric History:   Previous psychological history is significant for anxiety Outpatient Providers: [name], LCSW - [place].   History of Psych Hospitalization: No  Psychological Testing: NA  Family History: Mother - Anxiety.   Abuse History:  Victim of: No., na   Report needed: No. Victim of Neglect:No. Perpetrator of na  Witness / Exposure to Domestic Violence: No   Protective Services  Involvement: No  Witness to [employer] Violence:  No   Family History:  Family History  Problem Relation Age of Onset   Anxiety disorder Mother    Hypertension Mother    Arrhythmia Mother    Colon cancer Neg Hx    Esophageal cancer Neg Hx    Pancreatic cancer Neg Hx    Stomach cancer Neg Hx    Liver disease Neg Hx     Living situation: the patient lives with their family (mother and sister). Older brother lives with friends.   Sexual Orientation: Gay  Relationship Status: single. Was in a relationship ~6 years ago. Dated Jalen in high school. Jalen broke up with him after Eyob walked into the door and was embarrassed and discontinued contact for a week.  Name of spouse / other: n/a If a parent, number of children / ages:n/a  Support Systems: Mom & friend Leafy).   Financial Stress:  No   Income/Employment/Disability: No income. 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   Military Service: No   Educational History: Education: high school diploma/GED  Religion/Sprituality/World View: None  Any cultural differences that may affect / interfere with treatment:  not applicable   Recreation/Hobbies: Video Games (roblox) and Music (all kinds), cooking, swimming, and hiking.   Stressors: Other: being a passenger in a car, loud noises, stressors related to video-game stagnation.     Strengths: Video-games, cooking, fixing electronics.   Barriers:  mood.    Legal History: Pending legal issue / charges: The patient has  no significant history of legal issues. History of legal issue / charges: NA  Medical History/Surgical History: reviewed Past Medical History:  Diagnosis Date   Acid reflux    Anxiety     Past Surgical History:  Procedure Laterality Date   ESOPHAGEAL DILATION  06/2021   ESOPHAGOGASTRODUODENOSCOPY  06/2021    Medications: Current Outpatient Medications  Medication Sig Dispense Refill   augmented betamethasone dipropionate  (DIPROLENE-AF) 0.05 % ointment Apply topically daily.     Cholecalciferol (VITAMIN D ) 50 MCG (2000 UT) CAPS Take 1 capsule (2,000 Units total) by mouth daily. 90 capsule 3   famotidine  (PEPCID ) 40 MG tablet Take 1 tablet (40 mg total) by mouth daily. 90 tablet 2   ketoconazole  (NIZORAL ) 2 % shampoo APPLY TOPICALLY 2 TIMES A WEEK 120 mL 5   pantoprazole  (PROTONIX ) 40 MG tablet Take 1 tablet (40 mg total) by mouth daily. 90 tablet 2   No current facility-administered medications for this visit.    No Known Allergies  Diagnoses:  Other specified anxiety disorders  Psychiatric Treatment: No , na   Plan of Care: Outpatient therapy.   Narrative:  Joevon Gunderman participated from home, via video, and consented to treatment. Therapist participated from home office. We reviewed the limits of confidentiality prior to the start of the evaluation. Deni Wehmeyer expressed understanding and agreement to proceed. Audiel was initially referred by PCP due to his anxiety. This is his annual re-evaluation. He primarily endorsed anxiety symptoms include anxiety, difficulty managing worry, trouble relaxing, irritability, and feeling afraid something awful might happen. He denied any depressive symptoms or SI. His GAD-7 and PHQ-9 have both improved since beginning treatment with no reported depressive symptoms. He noted improved sleep, as well, but stated that progress can still me made in this area. He noted consistently applying interventions such as relaxation exercises & breathing exercises. He noted the benefit of communicating his feelings more openly and noted previously internalizing his feelings. He noted a need for additional progress in relation to blood draws, managing social anxiety, and manage anxiety while traveling by car, and anxiety when driving alone. Additionally, he noted a need to reduce over-thinking. Crispin would benefit from a complete medical check-up to complete labs. His most recent  labs are >1 year ago. He noted some improvement in his dermatological health. Rustin would benefit from continued counseling to address symptoms, process past events, bolster coping, manage distress, and develop a day-to-day routine.   GAD-7: 8 PHQ-9: 0    [name], LCSW

## 2024-09-28 ENCOUNTER — Encounter: Payer: Self-pay | Admitting: Psychology

## 2024-09-28 ENCOUNTER — Ambulatory Visit (INDEPENDENT_AMBULATORY_CARE_PROVIDER_SITE_OTHER): Admitting: Psychology

## 2024-09-28 DIAGNOSIS — F418 Other specified anxiety disorders: Secondary | ICD-10-CM

## 2024-09-28 NOTE — Progress Notes (Signed)
 [facility] Counselor/Therapist Progress Note  Patient ID: Scott [name], MRN: [id number]   Date: 09/28/24  Time Spent: 3:05  pm - 3:45 pm : 40 Minutes  Treatment Type: Individual Therapy.  Reported Symptoms: Anxiety  Mental Status Exam: Appearance:  Casual     Behavior: Appropriate  Motor: Normal  Speech/Language:  Normal Rate  Affect: Flat  Mood: dysthymic  Thought process: normal  Thought content:   WNL  Sensory/Perceptual disturbances:   WNL  Orientation: oriented to person, place, time/date, and situation  Attention: Good  Concentration: Good  Memory: WNL  Fund of knowledge:  Good  Insight:   Good  Judgment:  Good  Impulse Control: Good   Risk Assessment: Danger to Self:  No Self-injurious Behavior: No Danger to Others: No Duty to Warn:no Physical Aggression / Violence:No  Access to Firearms a concern: No  Gang Involvement:No   Subjective:   Scott [name] participated from home, via video and consented to treatment. Therapist participated from home office. I discussed the limitations of evaluation and management by telemedicine and the availability of in person appointments. The patient expressed understanding and agreed to proceed. Scott [name] reviewed the events of the past week.   We reviewed numerous treatment approaches including CBT, BA, Problem Solving, and Solution focused therapy. Psych-education regarding the Scott [name]'s diagnosis of Other specified anxiety disorders was provided during the session. We discussed Scott [name]'s goals treatment goals which include manage his anxiety, increase self-care including exercise, build and maintain routines, socialize more consistently.  Additional goals include managing symptoms, managing distress, desensitizing self from seeing wasps and the sight of blood, increased mindfulness, increase gratitude, verbalize thoughts and feelings more consistently, manage frustrations proactively. Challenge negative  self-talk and distortions. Scott [name] provided verbal approval of the treatment plan.   He noted recent frustration regarding a friends behavior. We will explore this, how to give feedback in the  moment, and how to relay feedback in conversation to resolve conflict and verbalize feelings and perspective.   Interventions: Psycho-education & Goal Setting.   Diagnosis:  Other specified anxiety disorders  Psychiatric Treatment: No , NA   Treatment Plan:  Client Abilities/Strengths Scott [name] is intelligent, self-aware, and motivated for change.   Support System: Family and friends.   Client Treatment Preferences OPT  Client Statement of Needs Scott [name] would like to manage his anxiety, increase self-care including exercise, build and maintain routines, socialize more consistently.  Additional goals include managing symptoms, managing distress, desensitizing self from seeing wasps and the sight of blood, increased mindfulness, increase gratitude, verbalize thoughts and feelings more consistently, manage frustrations proactively. Challenge negative self-talk and distortions.   Treatment Level Weekly  Symptoms  Anxiety: feeling anxious, difficulty managing worry, worrying about different things, trouble relaxing, irritability, and feeling afraid something awful might happen.  (Status: maintained)   Goals:   Scott [name] experiences symptoms of Anxiety.  Treatment plan signed and available on s-drive:   No, pending signature via MyChart.  Scott [name] was sent the treatment plan signature form on 09/28/24.   Target Date: 09/28/25 Frequency: Weekly  Progress: 0 Modality: individual    Therapist will provide referrals for additional resources as appropriate.  Therapist will provide psycho-education regarding Kellan's diagnosis and corresponding treatment approaches and interventions. Scott Mullet, LCSW will support the patient's ability to achieve the goals identified. will employ  CBT, BA, Problem-solving, Solution Focused, Mindfulness,  coping skills, & other evidenced-based practices will be used to promote progress towards healthy functioning to help manage decrease symptoms associated  with his diagnosis.   Reduce overall level, frequency, and intensity of the feelings of anxiety evidenced by decreased overall symptoms from 6 to 7 days/week to 0 to 1 days/week per client report for at least 3 consecutive months. Verbally express understanding of the relationship between feelings of anxiety its impact on thinking patterns and behaviors. Verbalize an understanding of the role that distorted thinking plays in creating fears, excessive worry, and ruminations.    ([name] participated in the creation of the treatment plan)   Scott Mullet, LCSW

## 2024-10-19 ENCOUNTER — Ambulatory Visit (INDEPENDENT_AMBULATORY_CARE_PROVIDER_SITE_OTHER): Admitting: Psychology

## 2024-10-19 DIAGNOSIS — F418 Other specified anxiety disorders: Secondary | ICD-10-CM | POA: Diagnosis not present

## 2024-10-19 NOTE — Progress Notes (Signed)
 [facility] Counselor/Therapist Progress Note  Patient ID: Laderrick Marsiglia, MRN: [id number]   Date: 10/19/24  Time Spent: 2:07 pm - 2:47 pm : 40 Minutes  Treatment Type: Individual Therapy.  Reported Symptoms: Anxiety  Mental Status Exam: Appearance:  Casual     Behavior: Appropriate  Motor: Normal  Speech/Language:  Normal Rate  Affect: Flat  Mood: dysthymic  Thought process: normal  Thought content:   WNL  Sensory/Perceptual disturbances:   WNL  Orientation: oriented to person, place, time/date, and situation  Attention: Good  Concentration: Good  Memory: WNL  Fund of knowledge:  Good  Insight:   Good  Judgment:  Good  Impulse Control: Good   Risk Assessment: Danger to Self:  No Self-injurious Behavior: No Danger to Others: No Duty to Warn:no Physical Aggression / Violence:No  Access to Firearms a concern: No  Gang Involvement:No   Subjective:   Flora Lormand participated from home, via video and consented to treatment. Therapist participated from home office. I discussed the limitations of evaluation and management by telemedicine and the availability of in person appointments. The patient expressed understanding and agreed to proceed. He noted feeling sick and noted experience light headedness. He noted his intent to speak with his medical provider  to discuss concerns. He noted recently going to a movie with friends and noted feeling anxious and feeling uncomfortable. He noted also being annoyed by other people's behavior during the movie. He noted the recent recognition that he is not confrontational and noted the downside of holding things in. He noted his worry that what he says might come out wrong. We worked on exploring this, identifying where his responsibility is in social interactions, and reviewed the tenets of positive communication and assertiveness. We role-played employing these tools during the session. Fernandez was engaged and  motivated during the session and expressed commitment towards goals. Therapist praised Nehemias for his effort and provided supportive therapy. A Follow-up was scheduled for continued treatment, which Jarman benefits from.      Interventions: CBT and interpersonal  Diagnosis:  Other specified anxiety disorders  Psychiatric Treatment: No , NA   Treatment Plan:  Client Abilities/Strengths Juma is intelligent, self-aware, and motivated for change.   Support System: Family and friends.   Client Treatment Preferences OPT  Client Statement of Needs Jameer would like to manage his anxiety, increase self-care including exercise, build and maintain routines, socialize more consistently. Additional goals include managing symptoms, managing distress, desensitizing self from seeing wasps and the sight of blood, increased mindfulness, increase gratitude, verbalize thoughts and feelings more consistently, manage frustrations proactively. Challenge negative self-talk and distortions.   Treatment Level Weekly  Symptoms  Anxiety: feeling anxious, difficulty managing worry, worrying about different things, trouble relaxing, irritability, and feeling afraid something awful might happen.  (Status: maintained)   Goals:   Jayren experiences symptoms of Anxiety.  Treatment plan signed and available on s-drive:   No, pending signature via MyChart.  Dago Barnick was sent the treatment plan signature form on 09/28/24.   Target Date: 09/28/25 Frequency: Weekly  Progress: 0 Modality: individual    Therapist will provide referrals for additional resources as appropriate.  Therapist will provide psycho-education regarding Dymir's diagnosis and corresponding treatment approaches and interventions. [name], LCSW will support the patient's ability to achieve the goals identified. will employ CBT, BA, Problem-solving, Solution Focused, Mindfulness,  coping skills, & other evidenced-based  practices will be used to promote progress towards healthy functioning to help manage decrease symptoms associated  with his diagnosis.   Reduce overall level, frequency, and intensity of the feelings of anxiety evidenced by decreased overall symptoms from 6 to 7 days/week to 0 to 1 days/week per client report for at least 3 consecutive months. Verbally express understanding of the relationship between feelings of anxiety its impact on thinking patterns and behaviors. Verbalize an understanding of the role that distorted thinking plays in creating fears, excessive worry, and ruminations.    (Sai participated in the creation of the treatment plan)   [name], LCSW

## 2024-10-29 ENCOUNTER — Ambulatory Visit: Admitting: Medical

## 2024-10-29 VITALS — BP 112/70 | HR 76 | Temp 97.0°F | Wt 179.0 lb

## 2024-10-29 DIAGNOSIS — H68003 Unspecified Eustachian salpingitis, bilateral: Secondary | ICD-10-CM

## 2024-10-29 DIAGNOSIS — R42 Dizziness and giddiness: Secondary | ICD-10-CM

## 2024-10-29 DIAGNOSIS — H1013 Acute atopic conjunctivitis, bilateral: Secondary | ICD-10-CM | POA: Diagnosis not present

## 2024-10-29 MED ORDER — MECLIZINE HCL 25 MG PO TABS: 12.5000 mg | ORAL_TABLET | Freq: Two times a day (BID) | ORAL | 0 refills | Status: AC | PRN

## 2024-10-29 NOTE — Progress Notes (Signed)
 Subjective:  Scott [name] is a 23 y.o. male who presents for Chief Complaint  Patient presents with   Acute Visit    Dizziness. Got an concussion over a year ago and his dizziness will come and go since this. Was sick around halloween and has had more dizziness since then, he doesn't faint, but close to halloween thought his body feel lighter     Scott [name] is a 23 year old male who presents with persistent lightheadedness.  He has been experiencing lightheadedness that was initially occasional but became constant from around Halloween until a week ago. The sensation is described as his whole body feeling light and neutral. Episodes last for seconds and are not associated with vertigo, except for a few instances when he stood up quickly. Sitting quietly helps alleviate the symptoms.  No chest pain, palpitations, shortness of breath, nausea, vomiting, or abdominal issues. He denies ear pressure, rhinorrhea, sneezing, or head pressure currently, although he did experience congestion and a lighter body feeling around Halloween. No irregular heartbeats or bleeding.  He does not consume alcohol, use drugs, or smoke. He has not taken any medication specifically for these symptoms, although he has used allergy medication in the past due to eczema and seasonal allergies. His current medications include Protonix , Pepcid , vitamin D , and an ointment. He reports no known allergies.  No other aggravating or relieving factors. No other complaint.  Past Medical History:  Diagnosis Date   Acid reflux    Anxiety    Current Outpatient Medications on File Prior to Visit  Medication Sig Dispense Refill   augmented betamethasone dipropionate (DIPROLENE-AF) 0.05 % ointment Apply topically daily.     Cholecalciferol (VITAMIN D ) 50 MCG (2000 UT) CAPS Take 1 capsule (2,000 Units total) by mouth daily. 90 capsule 3   famotidine  (PEPCID ) 40 MG tablet Take 1 tablet (40 mg total) by mouth daily. 90 tablet 2    ketoconazole  (NIZORAL ) 2 % shampoo APPLY TOPICALLY 2 TIMES A WEEK 120 mL 5   pantoprazole  (PROTONIX ) 40 MG tablet Take 1 tablet (40 mg total) by mouth daily. 90 tablet 2   No current facility-administered medications on file prior to visit.    The following portions of the patient's history were reviewed and updated as appropriate: allergies, current medications, past family history, past medical history, past social history, past surgical history and problem list.  ROS Otherwise as in subjective above    Objective: BP 112/70 (Patient Position: Standing)   Pulse 76   Temp (!) 97 F (36.1 C)   Wt 179 lb (81.2 kg)   SpO2 98%   BMI 28.89 kg/m   Wt Readings from Last 3 Encounters:  10/29/24 179 lb (81.2 kg)  05/02/24 176 lb (79.8 kg)  06/20/23 166 lb 3.2 oz (75.4 kg)   BP Readings from Last 3 Encounters:  10/29/24 112/70  05/02/24 122/80  06/20/23 120/78    General appearance: alert, no distress, well developed, well nourished HEENT: normocephalic, sclerae anicteric, conjunctiva with mild injection of erythema, TMs flat, nares patent, no discharge or erythema, pharynx normal Oral cavity: MMM, no lesions Neck: supple, no lymphadenopathy, no thyromegaly, no masses Heart: RRR, normal S1, S2, no murmurs Lungs: CTA bilaterally, no wheezes, rhonchi, or rales Pulses: 2+ radial pulses, 2+ pedal pulses, normal cap refill Ext: no edema Neuro: cn2-12 intact, nonfocal exam   Assessment: Encounter Diagnoses  Name Primary?   Dizziness Yes   Vertigo    Allergic conjunctivitis of both eyes  Eustachian catarrh, bilateral      Plan:  Lightheadedness and dizziness likely secondary to allergic rhinitis and inner ear dysfunction Intermittent lightheadedness and dizziness with occasional vertigo upon standing. Likely due to allergic rhinitis and inner ear dysfunction. Cardiac causes unlikely. - Ordered blood work to check electrolytes and blood count. - Prescribed meclizine twice  daily for 4-5 days, then reduce to once daily as symptoms improve. - Advised to monitor symptoms and report if no improvement after one week.  Allergic rhinitis Congestion and conjunctival redness likely due to seasonal allergens. Symptoms may be exacerbated by environmental factors. - Advised to drink 7-8 glasses of water daily. -use meclizine for 3-4 weeks   Scott [name] was seen today for acute visit.  Diagnoses and all orders for this visit:  Dizziness -     CBC -     Basic metabolic panel with GFR  Vertigo  Allergic conjunctivitis of both eyes  Eustachian catarrh, bilateral  Other orders -     meclizine (MEDI-MECLIZINE) 25 MG tablet; Take 0.5-1 tablets (12.5-25 mg total) by mouth 2 (two) times daily as needed for dizziness.    Follow up: pending labs

## 2024-10-30 ENCOUNTER — Ambulatory Visit: Payer: Self-pay | Admitting: Medical

## 2024-10-30 LAB — BASIC METABOLIC PANEL WITH GFR
BUN/Creatinine Ratio: 15 (ref 9–20)
BUN: 15 mg/dL (ref 6–20)
CO2: 23 mmol/L (ref 20–29)
Calcium: 9.6 mg/dL (ref 8.7–10.2)
Chloride: 101 mmol/L (ref 96–106)
Creatinine, Ser: 0.98 mg/dL (ref 0.76–1.27)
Glucose: 96 mg/dL (ref 70–99)
Potassium: 4.1 mmol/L (ref 3.5–5.2)
Sodium: 140 mmol/L (ref 134–144)
eGFR: 110 mL/min/1.73 (ref >59)

## 2024-10-30 LAB — CBC
Hematocrit: 44.9 % (ref 37.5–51.0)
Hemoglobin: 14.9 g/dL (ref 13.0–17.7)
MCH: 29.4 pg (ref 26.6–33.0)
MCHC: 33.2 g/dL (ref 31.5–35.7)
MCV: 89 fL (ref 79–97)
Platelets: 226 x10E3/uL (ref 150–450)
RBC: 5.07 x10E6/uL (ref 4.14–5.80)
RDW: 12.8 % (ref 11.6–15.4)
WBC: 5.9 x10E3/uL (ref 3.4–10.8)

## 2024-10-30 NOTE — Progress Notes (Signed)
 Results thru my chart

## 2024-11-14 ENCOUNTER — Ambulatory Visit: Admitting: Psychology

## 2024-11-14 DIAGNOSIS — F418 Other specified anxiety disorders: Secondary | ICD-10-CM

## 2024-11-14 NOTE — Progress Notes (Signed)
 [facility] Counselor/Therapist Progress Note  Patient ID: Milfred Sponsel, MRN: [id number]   Date: 11/14/24  Time Spent: 2:07 pm - 2:51 pm : 44 Minutes  Treatment Type: Individual Therapy.  Reported Symptoms: Anxiety  Mental Status Exam: Appearance:  Casual     Behavior: Appropriate  Motor: Normal  Speech/Language:  Normal Rate  Affect: Flat  Mood: dysthymic  Thought process: normal  Thought content:   WNL  Sensory/Perceptual disturbances:   WNL  Orientation: oriented to person, place, time/date, and situation  Attention: Good  Concentration: Good  Memory: WNL  Fund of knowledge:  Good  Insight:   Good  Judgment:  Good  Impulse Control: Good   Risk Assessment: Danger to Self:  No Self-injurious Behavior: No Danger to Others: No Duty to Warn:no Physical Aggression / Violence:No  Access to Firearms a concern: No  Gang Involvement:No   Subjective:   Miron Kreuzer participated from home, via video and consented to treatment. Therapist participated from home office. Chester reflected on the past two weeks and his thanksgiving holiday. He noted recently meeting with his PCP regarding feeling lightheaded. He noted being provided medicine but Braulio noted not receiving a diagnosis. He noted his intent to socialize with a friend and noted excitement regarding this. He noted his interest in getting a job but noted various barriers including transportation, his mother's busy schedule, and availability of jobs. He noted his father's lack of contact during the holiday and Jylan noted his frustration regarding this. He noted that his father often prioritizes children from a different marriage. He noted his father often contacting Robt's mother to complain that Yoshi and his sister do not reach out to him. He noted his mother redirecting his father to contact Javani directly to address issues. He noted a need to work on his self-esteem and provided an example of how he  walks. We worked on processing this during the session. Therapist encouraged Jacoby to define what good self-esteem is and where he would like to work specifically. We will explore this during our follow-up. Therapist encouraged self-care between sessions. Imad was engaged and motivated during the session. Therapist validated Captain's feelings and experience, during the session, and provided supportive therapy. A follow-up was scheduled for continued treatment, which Mavis continues to benefit from.   Interventions: CBT and interpersonal  Diagnosis:  Other specified anxiety disorders  Psychiatric Treatment: No , NA   Treatment Plan:  Client Abilities/Strengths Clancy is intelligent, self-aware, and motivated for change.   Support System: Family and friends.   Client Treatment Preferences OPT  Client Statement of Needs Decoda would like to manage his anxiety, increase self-care including exercise, build and maintain routines, socialize more consistently. Additional goals include managing symptoms, managing distress, desensitizing self from seeing wasps and the sight of blood, increased mindfulness, increase gratitude, verbalize thoughts and feelings more consistently, manage frustrations proactively. Challenge negative self-talk and distortions.   Treatment Level Weekly  Symptoms  Anxiety: feeling anxious, difficulty managing worry, worrying about different things, trouble relaxing, irritability, and feeling afraid something awful might happen.  (Status: maintained)   Goals:   Meiko experiences symptoms of Anxiety.  Treatment plan signed and available on s-drive:   No, pending signature via MyChart.  Nicolis Landino was sent the treatment plan signature form on 09/28/24.   Target Date: 09/28/25 Frequency: Weekly  Progress: 0 Modality: individual    Therapist will provide referrals for additional resources as appropriate.  Therapist will provide psycho-education  regarding Joevanni's diagnosis and  corresponding treatment approaches and interventions. [name], LCSW will support the patient's ability to achieve the goals identified. will employ CBT, BA, Problem-solving, Solution Focused, Mindfulness,  coping skills, & other evidenced-based practices will be used to promote progress towards healthy functioning to help manage decrease symptoms associated with his diagnosis.   Reduce overall level, frequency, and intensity of the feelings of anxiety evidenced by decreased overall symptoms from 6 to 7 days/week to 0 to 1 days/week per client report for at least 3 consecutive months. Verbally express understanding of the relationship between feelings of anxiety its impact on thinking patterns and behaviors. Verbalize an understanding of the role that distorted thinking plays in creating fears, excessive worry, and ruminations.    (Jahir participated in the creation of the treatment plan)   [name], LCSW

## 2024-12-20 ENCOUNTER — Ambulatory Visit: Admitting: Psychology

## 2024-12-20 DIAGNOSIS — F418 Other specified anxiety disorders: Secondary | ICD-10-CM

## 2024-12-20 NOTE — Progress Notes (Signed)
 [name] Counselor/Therapist Progress Note  Patient ID: Scott [name], MRN: [id number]   Date: 12/20/2024  Time Spent: 2:03 pm - 2:43 pm : 40 Minutes  Treatment Type: Individual Therapy.  Reported Symptoms: Anxiety  Mental Status Exam: Appearance:  Casual     Behavior: Appropriate  Motor: Normal  Speech/Language:  Normal Rate  Affect: Flat  Mood: dysthymic  Thought process: normal  Thought content:   WNL  Sensory/Perceptual disturbances:   WNL  Orientation: oriented to person, place, time/date, and situation  Attention: Good  Concentration: Good  Memory: WNL  Fund of knowledge:  Good  Insight:   Good  Judgment:  Good  Impulse Control: Good   Risk Assessment: Danger to Self:  No Self-injurious Behavior: No Danger to Others: No Duty to Warn:no Physical Aggression / Violence:No  Access to Firearms a concern: No  Gang Involvement:No   Subjective:   Scott [name] participated from home, via video and consented to treatment. Therapist participated from home office. Scott [name] noted recently being diagnosed with the flu and noted the effect of this on his mood and holiday, as well. He noted feeling better this past week. He noted a need to work on managing his anger,  which he noted as been more apparent in social interactions. He noted recently experiencing auditory sensitivity that possibly played a part towards his anger building. He noted frustration regarding having something else he needs to Address and listed the various health issues he has had to address in the recent past. He noted difficulty managing his frustration and provided the example of feeling possibly overstimulated and having difficulty asking others to quiet due to his frustration at the time. We worked on exploring ways to communicate needs proactively and reactively during the session. We worked on exploring additional coping skills during the session including communication, relaxation, and  creating physical space between self and stressor. Therapist encouraged Scott [name] to continue exploring this going forward. We reviewed communication, assertiveness, and boundary setting during the session. Therapist praised Scott [name] for his effort during the session and openness to consider different approaches. Therapist validated Scott [name]'s feelings and experience and provided supportive therapy. A Follow-up was scheduled for continued treatment, which he benefits from.   Interventions: CBT and interpersonal  Diagnosis:  Other specified anxiety disorders  Psychiatric Treatment: No , NA   Treatment Plan:  Client Abilities/Strengths Scott [name] is intelligent, self-aware, and motivated for change.   Support System: Family and friends.   Client Treatment Preferences OPT  Client Statement of Needs Scott [name] would like to manage his anxiety, increase self-care including exercise, build and maintain routines, socialize more consistently. Additional goals include managing symptoms, managing distress, desensitizing self from seeing wasps and the sight of blood, increased mindfulness, increase gratitude, verbalize thoughts and feelings more consistently, manage frustrations proactively. Challenge negative self-talk and distortions.   Treatment Level Weekly  Symptoms  Anxiety: feeling anxious, difficulty managing worry, worrying about different things, trouble relaxing, irritability, and feeling afraid something awful might happen.  (Status: maintained)   Goals:   Scott [name] experiences symptoms of Anxiety.  Treatment plan signed and available on s-drive:   No, pending signature via MyChart.  Scott [name] was sent the treatment plan signature form on 09/28/24.   Target Date: 09/28/25 Frequency: Weekly  Progress: 0 Modality: individual    Therapist will provide referrals for additional resources as appropriate.  Therapist will provide psycho-education regarding Scott [name]'s diagnosis and  corresponding treatment approaches and interventions. Scott [name], Scott [name] will support the patient's ability  to achieve the goals identified. will employ CBT, BA, Problem-solving, Solution Focused, Mindfulness,  coping skills, & other evidenced-based practices will be used to promote progress towards healthy functioning to help manage decrease symptoms associated with his diagnosis.   Reduce overall level, frequency, and intensity of the feelings of anxiety evidenced by decreased overall symptoms from 6 to 7 days/week to 0 to 1 days/week per client report for at least 3 consecutive months. Verbally express understanding of the relationship between feelings of anxiety its impact on thinking patterns and behaviors. Verbalize an understanding of the role that distorted thinking plays in creating fears, excessive worry, and ruminations.    (Scott [name] participated in the creation of the treatment plan)   Scott [name], Scott [name]

## 2025-01-11 ENCOUNTER — Ambulatory Visit: Admitting: Psychology

## 2025-01-11 DIAGNOSIS — F418 Other specified anxiety disorders: Secondary | ICD-10-CM

## 2025-01-31 ENCOUNTER — Ambulatory Visit: Admitting: Psychology

## 2025-05-08 ENCOUNTER — Encounter: Payer: Self-pay | Admitting: Medical
